# Patient Record
Sex: Female | Born: 1970 | Race: White | Hispanic: No | Marital: Married | Smoking: Never smoker
Health system: Southern US, Community
[De-identification: ages and names within clinical notes are randomized; demographics above are authoritative.]

## PROBLEM LIST (undated history)

## (undated) DIAGNOSIS — D649 Anemia, unspecified: Secondary | ICD-10-CM

## (undated) HISTORY — PX: NASAL SINUS SURGERY: SHX719

## (undated) HISTORY — PX: EYE SURGERY: SHX253

## (undated) HISTORY — PX: TUBAL LIGATION: SHX77

## (undated) HISTORY — PX: HAND SURGERY: SHX662

---

## 1997-08-12 ENCOUNTER — Inpatient Hospital Stay (HOSPITAL_COMMUNITY): Admission: AD | Admit: 1997-08-12 | Discharge: 1997-08-12 | Payer: Self-pay | Admitting: Obstetrics and Gynecology

## 1999-07-28 ENCOUNTER — Other Ambulatory Visit: Admission: RE | Admit: 1999-07-28 | Discharge: 1999-07-28 | Payer: Self-pay | Admitting: Obstetrics and Gynecology

## 1999-10-24 ENCOUNTER — Encounter: Payer: Self-pay | Admitting: Family Medicine

## 1999-10-24 ENCOUNTER — Encounter: Admission: RE | Admit: 1999-10-24 | Discharge: 1999-10-24 | Payer: Self-pay | Admitting: Family Medicine

## 2000-11-26 ENCOUNTER — Other Ambulatory Visit: Admission: RE | Admit: 2000-11-26 | Discharge: 2000-11-26 | Payer: Self-pay | Admitting: Obstetrics and Gynecology

## 2001-05-30 ENCOUNTER — Encounter: Admission: RE | Admit: 2001-05-30 | Discharge: 2001-05-30 | Payer: Self-pay | Admitting: Orthopedic Surgery

## 2001-05-30 ENCOUNTER — Encounter: Payer: Self-pay | Admitting: Orthopedic Surgery

## 2002-10-24 ENCOUNTER — Emergency Department (HOSPITAL_COMMUNITY): Admission: EM | Admit: 2002-10-24 | Discharge: 2002-10-24 | Payer: Self-pay | Admitting: Emergency Medicine

## 2003-01-07 ENCOUNTER — Other Ambulatory Visit: Admission: RE | Admit: 2003-01-07 | Discharge: 2003-01-07 | Payer: Self-pay | Admitting: Obstetrics and Gynecology

## 2003-09-02 ENCOUNTER — Emergency Department (HOSPITAL_COMMUNITY): Admission: EM | Admit: 2003-09-02 | Discharge: 2003-09-03 | Payer: Self-pay | Admitting: Emergency Medicine

## 2004-04-05 ENCOUNTER — Other Ambulatory Visit: Admission: RE | Admit: 2004-04-05 | Discharge: 2004-04-05 | Payer: Self-pay | Admitting: Obstetrics and Gynecology

## 2004-04-14 ENCOUNTER — Encounter: Admission: RE | Admit: 2004-04-14 | Discharge: 2004-04-14 | Payer: Self-pay | Admitting: Obstetrics and Gynecology

## 2011-03-29 ENCOUNTER — Inpatient Hospital Stay (HOSPITAL_COMMUNITY)
Admission: AD | Admit: 2011-03-29 | Discharge: 2011-03-29 | Disposition: A | Discharge status: Home / Self Care | Payer: BC Managed Care – PPO | Source: Ambulatory Visit | Attending: Obstetrics and Gynecology | Admitting: Obstetrics and Gynecology

## 2011-03-29 ENCOUNTER — Other Ambulatory Visit: Payer: Self-pay | Admitting: Obstetrics and Gynecology

## 2011-03-29 ENCOUNTER — Encounter (HOSPITAL_COMMUNITY): Payer: Self-pay

## 2011-03-29 DIAGNOSIS — O00109 Unspecified tubal pregnancy without intrauterine pregnancy: Secondary | ICD-10-CM | POA: Insufficient documentation

## 2011-03-29 HISTORY — DX: Anemia, unspecified: D64.9

## 2011-03-29 LAB — CBC
HCT: 38.6 % (ref 36.0–46.0)
Hemoglobin: 12.7 g/dL (ref 12.0–15.0)
MCH: 28.9 pg (ref 26.0–34.0)
MCHC: 32.9 g/dL (ref 30.0–36.0)
MCV: 87.9 fL (ref 78.0–100.0)
Platelets: 434 10*3/uL — ABNORMAL HIGH (ref 150–400)
RBC: 4.39 MIL/uL (ref 3.87–5.11)
RDW: 13 % (ref 11.5–15.5)
WBC: 9 10*3/uL (ref 4.0–10.5)

## 2011-03-29 LAB — DIFFERENTIAL
Basophils Absolute: 0 10*3/uL (ref 0.0–0.1)
Basophils Relative: 0 % (ref 0–1)
Eosinophils Absolute: 0.1 10*3/uL (ref 0.0–0.7)
Eosinophils Relative: 1 % (ref 0–5)
Lymphocytes Relative: 18 % (ref 12–46)
Lymphs Abs: 1.6 10*3/uL (ref 0.7–4.0)
Monocytes Absolute: 0.7 10*3/uL (ref 0.1–1.0)
Monocytes Relative: 8 % (ref 3–12)
Neutro Abs: 6.5 10*3/uL (ref 1.7–7.7)
Neutrophils Relative %: 73 % (ref 43–77)

## 2011-03-29 LAB — CREATININE, SERUM
Creatinine, Ser: 0.66 mg/dL (ref 0.50–1.10)
GFR calc Af Amer: >90 mL/min (ref >90)
GFR calc non Af Amer: >90 mL/min (ref >90)

## 2011-03-29 LAB — ABO/RH: ABO/RH(D): A NEG

## 2011-03-29 LAB — HCG, QUANTITATIVE, PREGNANCY: hCG, Beta Chain, Quant, S: 40 m[IU]/mL — ABNORMAL HIGH (ref <5)

## 2011-03-29 LAB — BUN: BUN: 7 mg/dL (ref 6–23)

## 2011-03-29 LAB — TYPE AND SCREEN
ABO/RH(D): A NEG
Antibody Screen: NEGATIVE

## 2011-03-29 LAB — AST: AST: 26 U/L (ref 0–37)

## 2011-03-29 MED ORDER — METHOTREXATE INJECTION FOR WOMEN'S HOSPITAL
50.0000 mg/m2 | Freq: Once | INTRAMUSCULAR | Status: AC
  Administered 2011-03-29: 85 mg via INTRAMUSCULAR
  Filled 2011-03-29: qty 1.7

## 2011-03-29 MED ORDER — RHO D IMMUNE GLOBULIN 1500 UNIT/2ML IJ SOLN
300.0000 ug | Freq: Once | INTRAMUSCULAR | Status: AC
  Administered 2011-03-29: 300 ug via INTRAMUSCULAR
  Filled 2011-03-29: qty 2

## 2011-03-29 MED ORDER — METHOTREXATE INJECTION FOR WOMEN'S HOSPITAL
50.0000 mg/m2 | Freq: Once | INTRAMUSCULAR | Status: DC
  Filled 2011-03-29: qty 1.5

## 2011-03-29 NOTE — ED Notes (Signed)
No adverse effect from methotrexate or rhophylac. Pt voices understanding to return Sunday for repeat bhcg. Comfort care provided, pt to review at home and will call with any questions or concerns.

## 2011-03-29 NOTE — ED Provider Notes (Signed)
Andrea Johns is a 41 y.o. female @ [redacted]w[redacted]d gestation who presents to MAU for MTX for ectopic pregnancy. She was evaluated in the office and orders for labs were entered prior to patient arrival. She is alert and oriented and in no acute distress. Medical screening exam complete and patient is stable to proceed with plan of care.    [place], [place] 03/29/11 1416

## 2011-03-29 NOTE — Progress Notes (Signed)
Pt states sent from MD office for MTX workup/inj. U/S confirmed left ectopic. Pt reports mild lower abd pain 3/10. Intermittent back pain. Reports heavy bleeding yesterday, lessened today.

## 2011-03-30 LAB — RH IG WORKUP (INCLUDES ABO/RH): ABO/RH(D): A NEG

## 2011-04-01 ENCOUNTER — Inpatient Hospital Stay (HOSPITAL_COMMUNITY)
Admission: AD | Admit: 2011-04-01 | Discharge: 2011-04-01 | Disposition: A | Discharge status: Home / Self Care | Payer: BC Managed Care – PPO | Source: Ambulatory Visit | Attending: Obstetrics and Gynecology | Admitting: Obstetrics and Gynecology

## 2011-04-01 DIAGNOSIS — O00109 Unspecified tubal pregnancy without intrauterine pregnancy: Secondary | ICD-10-CM | POA: Insufficient documentation

## 2011-04-01 DIAGNOSIS — O009 Unspecified ectopic pregnancy without intrauterine pregnancy: Secondary | ICD-10-CM

## 2011-04-01 NOTE — ED Notes (Signed)
M.Williams,CNM  Discussed  Results and POC with pt to come back for day 7 BHCG. Pt verbalized understanding.

## 2011-04-01 NOTE — ED Provider Notes (Signed)
History     Chief Complaint  Patient presents with  . Ectopic Pregnancy   HPI This is a 41 y.o. female at [redacted]w[redacted]d with a known ectopic pregnancy who presents for followup HCG following Methotrexate.  She denies pain and has had some light bleeding.   OB History    Grav Para Term Preterm Abortions TAB SAB Ect Mult Living   4 3 3  1   1  3       Past Medical History  Diagnosis Date  . Anemia     Past Surgical History  Procedure Date  . Hand surgery   . Eye surgery   . Tubal ligation     and reversal    Family History  Problem Relation Age of Onset  . Anesthesia problems Neg Hx     History  Substance Use Topics  . Smoking status: Never Smoker   . Smokeless tobacco: Never Used  . Alcohol Use: 0.6 oz/week    1 Glasses of wine per week    Allergies:  Allergies  Allergen Reactions  . Ciprofloxacin Nausea And Vomiting  . Codeine Nausea And Vomiting    Prescriptions prior to admission  Medication Sig Dispense Refill  . acetaminophen (TYLENOL) 500 MG tablet Take 500 mg by mouth every 6 (six) hours as needed. For pain and cramping      . Prenatal Vit-Fe Fumarate-FA (PRENATAL MULTIVITAMIN) TABS Take 1 tablet by mouth daily.        ROS As above  Physical Exam   Blood pressure 122/72, pulse 66, temperature 98.5 F (36.9 C), temperature source Oral, resp. rate 18, height 5\' 1"  (1.549 m), weight 140 lb 6.4 oz (63.685 kg), last menstrual period 02/11/2011, unknown if currently breastfeeding.  Physical Exam Deferred Results for orders placed during the hospital encounter of 04/01/11 (from the past 24 hour(s))  HCG, QUANTITATIVE, PREGNANCY     Status: Abnormal   Collection Time   04/01/11 11:18 AM      Component Value Range   hCG, Beta Chain, Quant, S 10 (*) <5 (mIU/mL)   Last Quant was 40  MAU Course  Procedures  Assessment and Plan  A:  Ectopic pregnancy s/p Methotrexate      Appropriately decreasing Quant HCGs P:  Will proceed with Day 7 HCG      Ectopic  precautions  Andrea Johns 04/01/2011, 12:16 PM

## 2011-04-01 NOTE — Progress Notes (Signed)
Day 4 MTX follow up. Pt reports still having vaginal bleeding with occational clots reports mild cramping.

## 2011-04-04 ENCOUNTER — Inpatient Hospital Stay (HOSPITAL_COMMUNITY)
Admission: AD | Admit: 2011-04-04 | Discharge: 2011-04-04 | Disposition: A | Discharge status: Home / Self Care | Payer: BC Managed Care – PPO | Source: Ambulatory Visit | Attending: Obstetrics and Gynecology | Admitting: Obstetrics and Gynecology

## 2011-04-04 DIAGNOSIS — O009 Unspecified ectopic pregnancy without intrauterine pregnancy: Secondary | ICD-10-CM

## 2011-04-04 DIAGNOSIS — O00109 Unspecified tubal pregnancy without intrauterine pregnancy: Secondary | ICD-10-CM | POA: Insufficient documentation

## 2011-04-04 NOTE — Progress Notes (Signed)
Patient to MAU for repeat BHCG. Denies any pain or bleeding today.

## 2011-04-04 NOTE — ED Provider Notes (Signed)
History   Pt presents today on day 7 s/p methotrexate for a left ectopic preg. She states she is doing well and is having no problems. She now only has minimal pain and virtually no vag bleeding. She has no new complaints at this time.  Chief Complaint  Patient presents with  . Follow-up   HPI  OB History    Grav Para Term Preterm Abortions TAB SAB Ect Mult Living   4 3 3  1   1  3       Past Medical History  Diagnosis Date  . Anemia     Past Surgical History  Procedure Date  . Hand surgery   . Eye surgery   . Tubal ligation     and reversal    Family History  Problem Relation Age of Onset  . Anesthesia problems Neg Hx     History  Substance Use Topics  . Smoking status: Never Smoker   . Smokeless tobacco: Never Used  . Alcohol Use: 0.6 oz/week    1 Glasses of wine per week    Allergies:  Allergies  Allergen Reactions  . Ciprofloxacin Nausea And Vomiting  . Codeine Nausea And Vomiting    Prescriptions prior to admission  Medication Sig Dispense Refill  . acetaminophen (TYLENOL) 500 MG tablet Take 500 mg by mouth every 6 (six) hours as needed. For pain and cramping      . Prenatal Vit-Fe Fumarate-FA (PRENATAL MULTIVITAMIN) TABS Take 1 tablet by mouth daily.        Review of Systems  Constitutional: Negative for fever and chills.  Eyes: Negative for blurred vision and double vision.  Cardiovascular: Negative for chest pain.  Gastrointestinal: Negative for nausea, vomiting, abdominal pain, diarrhea and constipation.  Genitourinary: Negative for dysuria, urgency and frequency.  Neurological: Negative for dizziness and headaches.  Psychiatric/Behavioral: Negative for depression and suicidal ideas.   Physical Exam   Blood pressure 136/74, pulse 91, temperature 98.5 F (36.9 C), temperature source Oral, resp. rate 16, last menstrual period 02/11/2011, SpO2 99.00%, unknown if currently breastfeeding.  Physical Exam  Nursing note and vitals  reviewed. Constitutional: She is oriented to person, place, and time. She appears well-developed and well-nourished. No distress.  HENT:  Head: Normocephalic and atraumatic.  Eyes: EOM are normal. Pupils are equal, round, and reactive to light.  GI: Soft. She exhibits no distension and no mass. There is no tenderness. There is no rebound and no guarding.  Neurological: She is alert and oriented to person, place, and time.  Skin: Skin is warm and dry. She is not diaphoretic.  Psychiatric: She has a normal mood and affect. Her behavior is normal. Judgment and thought content normal.    MAU Course  Procedures  Results for orders placed during the hospital encounter of 04/04/11 (from the past 24 hour(s))  HCG, QUANTITATIVE, PREGNANCY     Status: Normal   Collection Time   04/04/11  9:58 AM      Component Value Range   hCG, Beta Chain, Quant, S 3  <5 (mIU/mL)   Discussed pt with Dr. Ambrose Mantle.  Assessment and Plan  Ectopic preg: pt doing well. She will f/u in Dr. Ebony Hail office. Discussed diet, activity, risks, and precautions.  Clinton Gallant. Rice III, DrHSc, MPAS, PA-C  04/04/2011, 9:45 AM   Henrietta Hoover, PA 04/04/11 1042

## 2012-02-21 ENCOUNTER — Other Ambulatory Visit (HOSPITAL_COMMUNITY): Payer: Self-pay | Admitting: Obstetrics and Gynecology

## 2012-02-21 DIAGNOSIS — O3680X0 Pregnancy with inconclusive fetal viability, not applicable or unspecified: Secondary | ICD-10-CM

## 2012-02-22 ENCOUNTER — Inpatient Hospital Stay (HOSPITAL_COMMUNITY)
Admission: AD | Admit: 2012-02-22 | Discharge: 2012-02-22 | Disposition: A | Discharge status: Home / Self Care | Payer: BC Managed Care – PPO | Source: Ambulatory Visit | Attending: Obstetrics and Gynecology | Admitting: Obstetrics and Gynecology

## 2012-02-22 ENCOUNTER — Encounter (HOSPITAL_COMMUNITY): Payer: Self-pay | Admitting: *Deleted

## 2012-02-22 DIAGNOSIS — O009 Unspecified ectopic pregnancy without intrauterine pregnancy: Secondary | ICD-10-CM

## 2012-02-22 DIAGNOSIS — O00109 Unspecified tubal pregnancy without intrauterine pregnancy: Secondary | ICD-10-CM | POA: Insufficient documentation

## 2012-02-22 LAB — CBC WITH DIFFERENTIAL/PLATELET
Basophils Absolute: 0 10*3/uL (ref 0.0–0.1)
Basophils Relative: 0 % (ref 0–1)
MCHC: 33.4 g/dL (ref 30.0–36.0)
Neutro Abs: 8.4 10*3/uL — ABNORMAL HIGH (ref 1.7–7.7)
Neutrophils Relative %: 75 % (ref 43–77)
Platelets: 467 10*3/uL — ABNORMAL HIGH (ref 150–400)
RDW: 13.1 % (ref 11.5–15.5)

## 2012-02-22 LAB — CREATININE, SERUM
GFR calc Af Amer: >90 mL/min (ref >90)
GFR calc non Af Amer: >90 mL/min (ref >90)

## 2012-02-22 LAB — HCG, QUANTITATIVE, PREGNANCY: hCG, Beta Chain, Quant, S: 330 m[IU]/mL — ABNORMAL HIGH (ref <5)

## 2012-02-22 LAB — BUN: BUN: 7 mg/dL (ref 6–23)

## 2012-02-22 LAB — AST: AST: 18 U/L (ref 0–37)

## 2012-02-22 MED ORDER — METHOTREXATE INJECTION FOR WOMEN'S HOSPITAL
50.0000 mg/m2 | Freq: Once | INTRAMUSCULAR | Status: AC
  Administered 2012-02-22: 85 mg via INTRAMUSCULAR
  Filled 2012-02-22: qty 1.7

## 2012-02-22 NOTE — MAU Provider Note (Signed)
History     CSN: [id number]  Arrival date and time: 02/22/12 1946   First Provider Initiated Contact with Patient 02/22/12 2004      Chief Complaint  Patient presents with  . Injections   HPI Andrea Johns is 41 y.o. [id number] Unknown weeks presenting with ? Ectopic pregnancy.  Dr Ellyn Hack called earlier to tell us she would be coming in for MTX.  Patient states she had doubling BHCGs until today when there was a very slight rise in the result in 48 hrs.  She states she feels good today but had lower right pelvic pain earlier this week.  She states she is not bleeding but having dark discharge like"old blood".      Past Medical History  Diagnosis Date  . Anemia     Past Surgical History  Procedure Date  . Hand surgery   . Eye surgery   . Tubal ligation     and reversal    Family History  Problem Relation Age of Onset  . Anesthesia problems Neg Hx     History  Substance Use Topics  . Smoking status: Never Smoker   . Smokeless tobacco: Never Used  . Alcohol Use: 0.6 oz/week    1 Glasses of wine per week    Allergies:  Allergies  Allergen Reactions  . Ciprofloxacin Nausea And Vomiting  . Codeine Nausea And Vomiting    Prescriptions prior to admission  Medication Sig Dispense Refill  . acetaminophen (TYLENOL) 500 MG tablet Take 500 mg by mouth every 6 (six) hours as needed. For pain and cramping      . Prenatal Vit-Fe Fumarate-FA (PRENATAL MULTIVITAMIN) TABS Take 1 tablet by mouth daily.        Review of Systems  Constitutional: Negative.   Gastrointestinal: Negative for abdominal pain.  Genitourinary:       + for scant dark discharge   Physical Exam   Blood pressure 134/82, pulse 97, temperature 98 F (36.7 C), resp. rate 20, last menstrual period 01/15/2012.  Physical Exam  Constitutional: She is oriented to person, place, and time. She appears well-developed and well-nourished. No distress.  Cardiovascular: Normal rate.   Respiratory: Effort  normal.  Genitourinary:       Not indicated  Neurological: She is alert and oriented to person, place, and time.  Psychiatric: She has a normal mood and affect. Her behavior is normal.   Results for orders placed during the hospital encounter of 02/22/12 (from the past 24 hour(s))  CBC WITH DIFFERENTIAL     Status: Abnormal   Collection Time   02/22/12  8:13 PM      Component Value Range   WBC 11.1 (*) 4.0 - 10.5 K/uL   RBC 4.39  3.87 - 5.11 MIL/uL   Hemoglobin 12.8  12.0 - 15.0 g/dL   HCT 14.7  82.9 - 56.2 %   MCV 87.2  78.0 - 100.0 fL   MCH 29.2  26.0 - 34.0 pg   MCHC 33.4  30.0 - 36.0 g/dL   RDW 13.0  86.5 - 78.4 %   Platelets 467 (*) 150 - 400 K/uL   Neutrophils Relative 75  43 - 77 %   Neutro Abs 8.4 (*) 1.7 - 7.7 K/uL   Lymphocytes Relative 17  12 - 46 %   Lymphs Abs 1.9  0.7 - 4.0 K/uL   Monocytes Relative 8  3 - 12 %   Monocytes Absolute 0.9  0.1 - 1.0 K/uL  Eosinophils Relative 0  0 - 5 %   Eosinophils Absolute 0.0  0.0 - 0.7 K/uL   Basophils Relative 0  0 - 1 %   Basophils Absolute 0.0  0.0 - 0.1 K/uL   MAU Course  Procedures  MDM Discussed at length the reason patient was here.  Gave her time to ask questions.  She states that with previous pregnancies she felt pregnant and that she knew things were good but this week, she doesn't feel pregnancy.  She states she wants to proceed with MTX. Reported to Dr. Ellyn Hack that in patient's record from 1/13--her blood type is A Neg.  Order given to delay Rhophylac until bleeding, per Dr. Ellyn Hack.  20:36 Care turned over to H. Mathews Robinsons, CNM.  She will discharge the patient. Assessment and Plan  A:  Non Reassuring BHCGs       Ectopic pregnancy  P: Methotrexate given.     Return on Monday, Dec 23 for repeat BHCG    Ectopic precautions given  [name],EVE M 02/22/2012, 8:14 PM

## 2012-02-22 NOTE — MAU Note (Signed)
Pt here for MTX. States Dr Ellyn Hack did u/s today and has "junky R ovary". Possibly ruptured cyst on R but BHCG not rising appropriately. Had similar situation in January and received MTX then for ?ectopic vs SAB.

## 2012-02-22 NOTE — Progress Notes (Signed)
Written and verbal d/c instructions given and understanding voiced. Have discussed ectopic precautions and pt has handout and reviewed.

## 2012-02-25 ENCOUNTER — Inpatient Hospital Stay (HOSPITAL_COMMUNITY)
Admission: AD | Admit: 2012-02-25 | Discharge: 2012-02-25 | Disposition: A | Discharge status: Home / Self Care | Payer: BC Managed Care – PPO | Source: Ambulatory Visit | Attending: Obstetrics and Gynecology | Admitting: Obstetrics and Gynecology

## 2012-02-25 DIAGNOSIS — O00109 Unspecified tubal pregnancy without intrauterine pregnancy: Secondary | ICD-10-CM

## 2012-02-25 DIAGNOSIS — Z298 Encounter for other specified prophylactic measures: Secondary | ICD-10-CM | POA: Insufficient documentation

## 2012-02-25 DIAGNOSIS — Z2989 Encounter for other specified prophylactic measures: Secondary | ICD-10-CM | POA: Insufficient documentation

## 2012-02-25 DIAGNOSIS — O009 Unspecified ectopic pregnancy without intrauterine pregnancy: Secondary | ICD-10-CM

## 2012-02-25 MED ORDER — RHO D IMMUNE GLOBULIN 1500 UNIT/2ML IJ SOLN
300.0000 ug | INTRAMUSCULAR | Status: AC
  Administered 2012-02-25: 300 ug via INTRAMUSCULAR
  Filled 2012-02-25: qty 2

## 2012-02-25 NOTE — MAU Note (Signed)
Had had some brownish spotting, became red, though is a light flow last night.  Feels so much gas on stomach.

## 2012-02-25 NOTE — MAU Note (Signed)
Pt here for day 4 hcg post MTX. Started bleeding yesterday. Asking about Rhophyllac/ paged  Dr Senaida Ores, regarding order .

## 2012-02-25 NOTE — MAU Provider Note (Signed)
  History     CSN: [id number]  Arrival date and time: 02/25/12 0941   None     No chief complaint on file.  HPI  Pt is here for Day 4 HCG after MTX and Rhophylac.  Pt is without complaint, no spotting, bleeding or cramping.  Past Medical History  Diagnosis Date  . Anemia     Past Surgical History  Procedure Date  . Hand surgery   . Eye surgery   . Tubal ligation     and reversal    Family History  Problem Relation Age of Onset  . Anesthesia problems Neg Hx     History  Substance Use Topics  . Smoking status: Never Smoker   . Smokeless tobacco: Never Used  . Alcohol Use: 0.6 oz/week    1 Glasses of wine per week    Allergies:  Allergies  Allergen Reactions  . Ciprofloxacin Nausea And Vomiting  . Codeine Nausea And Vomiting    Prescriptions prior to admission  Medication Sig Dispense Refill  . acetaminophen (TYLENOL) 500 MG tablet Take 500 mg by mouth every 6 (six) hours as needed. For pain and cramping      . Prenatal Vit-Fe Fumarate-FA (PRENATAL MULTIVITAMIN) TABS Take 1 tablet by mouth daily.        ROS Physical Exam   Last menstrual period 01/15/2012.  Physical Exam  Vitals reviewed. Constitutional: She is oriented to person, place, and time. She appears well-developed and well-nourished.  HENT:  Head: Normocephalic.  Eyes: Pupils are equal, round, and reactive to light.  Neck: Normal range of motion. Neck supple.  Cardiovascular: Normal rate.   Respiratory: Effort normal.  Musculoskeletal: Normal range of motion.  Neurological: She is alert and oriented to person, place, and time.  Skin: Skin is warm and dry.  Psychiatric: She has a normal mood and affect.    MAU Course  Procedures Results for orders placed during the hospital encounter of 02/25/12 (from the past 24 hour(s))  HCG, QUANTITATIVE, PREGNANCY     Status: Abnormal   Collection Time   02/25/12 10:04 AM      Component Value Range   hCG, Beta Chain, Quant, S 57 (*) <5 mIU/mL   RH IG WORKUP (INCLUDES ABO/RH)     Status: Normal (Preliminary result)   Collection Time   02/25/12 10:18 AM      Component Value Range   Gestational Age(Wks) 5.6     ABO/RH(D) A NEG     Antibody Screen NEG     Unit Number [id number]     Blood Component Type RHIG     Unit division 00     Status of Unit ISSUED     Transfusion Status OK TO TRANSFUSE      Assessment and Plan  Day 4 s/p MTX F/u Day 7 for repeat HCG Sooner if increase in pain or bleeding  Danylle Ouk 02/25/2012, 1:17 PM

## 2012-02-26 LAB — RH IG WORKUP (INCLUDES ABO/RH)
ABO/RH(D): A NEG
Unit division: 0

## 2012-02-28 ENCOUNTER — Ambulatory Visit (HOSPITAL_COMMUNITY): Admission: RE | Admit: 2012-02-28 | Payer: BC Managed Care – PPO | Source: Ambulatory Visit

## 2012-02-28 ENCOUNTER — Other Ambulatory Visit (HOSPITAL_COMMUNITY): Payer: Self-pay | Admitting: Obstetrics and Gynecology

## 2012-02-28 ENCOUNTER — Inpatient Hospital Stay (HOSPITAL_COMMUNITY)
Admission: AD | Admit: 2012-02-28 | Discharge: 2012-02-28 | Disposition: A | Discharge status: Home / Self Care | Payer: BC Managed Care – PPO | Source: Ambulatory Visit | Attending: Obstetrics and Gynecology | Admitting: Obstetrics and Gynecology

## 2012-02-28 DIAGNOSIS — O009 Unspecified ectopic pregnancy without intrauterine pregnancy: Secondary | ICD-10-CM

## 2012-02-28 DIAGNOSIS — O00109 Unspecified tubal pregnancy without intrauterine pregnancy: Secondary | ICD-10-CM

## 2012-02-28 DIAGNOSIS — O3680X0 Pregnancy with inconclusive fetal viability, not applicable or unspecified: Secondary | ICD-10-CM

## 2012-02-28 LAB — HCG, QUANTITATIVE, PREGNANCY: hCG, Beta Chain, Quant, S: 40 m[IU]/mL — ABNORMAL HIGH (ref <5)

## 2012-02-28 NOTE — MAU Note (Signed)
Patient to MAU for day 7 BHCG s/p MTX. Patient states she does have a little right lower abdominal pressure only with a full bladder. No other pain. Has red bleeding on and off.

## 2012-02-28 NOTE — MAU Provider Note (Signed)
  History     CSN: [id number]  Arrival date and time: 02/28/12 1156   None     Chief Complaint  Patient presents with  . Follow-up   HPI Andrea Johns is here for FU BHCG, day 7 after MTX injection. She denies any pain, and is having some very light bleeding.   Past Medical History  Diagnosis Date  . Anemia     Past Surgical History  Procedure Date  . Hand surgery   . Eye surgery   . Tubal ligation     and reversal    Family History  Problem Relation Age of Onset  . Anesthesia problems Neg Hx     History  Substance Use Topics  . Smoking status: Never Smoker   . Smokeless tobacco: Never Used  . Alcohol Use: 0.6 oz/week    1 Glasses of wine per week    Allergies:  Allergies  Allergen Reactions  . Ciprofloxacin Nausea And Vomiting  . Codeine Nausea And Vomiting    Prescriptions prior to admission  Medication Sig Dispense Refill  . acetaminophen (TYLENOL) 500 MG tablet Take 500 mg by mouth every 6 (six) hours as needed. For pain and cramping        Review of Systems  Gastrointestinal: Negative for nausea, vomiting and abdominal pain.  Neurological: Negative for dizziness.   Physical Exam   Blood pressure 144/72, pulse 70, temperature 98 F (36.7 C), temperature source Oral, resp. rate 16, last menstrual period 01/15/2012, SpO2 100.00%.  Physical Exam  Nursing note and vitals reviewed. Constitutional: She is oriented to person, place, and time. She appears well-developed and well-nourished. No distress.  Cardiovascular: Normal rate.   Respiratory: Effort normal.  Neurological: She is alert and oriented to person, place, and time.  Skin: Skin is warm and dry.  Psychiatric: She has a normal mood and affect.    MAU Course  Procedures  Results for orders placed during the hospital encounter of 02/28/12 (from the past 24 hour(s))  HCG, QUANTITATIVE, PREGNANCY     Status: Abnormal   Collection Time   02/28/12 12:15 PM      Component Value Range    hCG, Beta Chain, Quant, S 40 (*) <5 mIU/mL    Assessment and Plan  Ectopic pregnancy s/p MTX day 7 Bhcg is decreasing Danger signs reviewed FU in 1 week for repeat BHCG.  Pt is leaving for [place]. Understands that she needs to head to ER there with any new onset abdominal pain or increased vaginal bleeding.  Tawnya Crook 02/28/2012, 12:59 PM

## 2012-03-06 ENCOUNTER — Inpatient Hospital Stay (HOSPITAL_COMMUNITY)
Admission: AD | Admit: 2012-03-06 | Discharge: 2012-03-06 | Disposition: A | Discharge status: Home / Self Care | Payer: BC Managed Care – PPO | Source: Ambulatory Visit | Attending: Obstetrics and Gynecology | Admitting: Obstetrics and Gynecology

## 2012-03-06 ENCOUNTER — Ambulatory Visit (HOSPITAL_COMMUNITY): Payer: BC Managed Care – PPO

## 2012-03-06 DIAGNOSIS — O00109 Unspecified tubal pregnancy without intrauterine pregnancy: Secondary | ICD-10-CM | POA: Insufficient documentation

## 2012-03-06 DIAGNOSIS — O009 Unspecified ectopic pregnancy without intrauterine pregnancy: Secondary | ICD-10-CM

## 2012-03-06 LAB — HCG, QUANTITATIVE, PREGNANCY: hCG, Beta Chain, Quant, S: 21 m[IU]/mL — ABNORMAL HIGH (ref <5)

## 2012-03-06 NOTE — ED Notes (Signed)
Discussed results and plan to have pt f/u in one week for repeat BHCG. Pt agreeable to plan.

## 2012-03-06 NOTE — MAU Note (Signed)
Patient to MAU for follow up BHCG s/p MTX. Patient states she still has a slight pain in the right side especially with a full bladder. Has bleeding off and on, less than a period.

## 2012-03-06 NOTE — MAU Provider Note (Signed)
Attestation of Attending Supervision of Advanced Practitioner (CNM/NP): Evaluation and management procedures were performed by the Advanced Practitioner under my supervision and collaboration.  I have reviewed the Advanced Practitioner's note and chart, and I agree with the management and plan.  HARRAWAY-SMITH, Joeanna Howdyshell 5:39 PM

## 2012-03-06 NOTE — MAU Provider Note (Signed)
  History     CSN: [id number]  Arrival date and time: 03/06/12 1522   None     Chief Complaint  Patient presents with  . Follow-up   HPI Ms. Becci Batty is a 42 y.o. G5 P3013 who presents to MAU today for follow-up quant bHCG. She is 14 days s/p MTX. She is not having any bleeding and mild RLQ pain on occasion.   OB History    Grav Para Term Preterm Abortions TAB SAB Ect Mult Living   5 3 3  1   1  3       Past Medical History  Diagnosis Date  . Anemia     Past Surgical History  Procedure Date  . Hand surgery   . Eye surgery   . Tubal ligation     and reversal    Family History  Problem Relation Age of Onset  . Anesthesia problems Neg Hx     History  Substance Use Topics  . Smoking status: Never Smoker   . Smokeless tobacco: Never Used  . Alcohol Use: 0.6 oz/week    1 Glasses of wine per week    Allergies:  Allergies  Allergen Reactions  . Ciprofloxacin Nausea And Vomiting  . Codeine Nausea And Vomiting    Prescriptions prior to admission  Medication Sig Dispense Refill  . acetaminophen (TYLENOL) 500 MG tablet Take 500 mg by mouth every 6 (six) hours as needed. For pain and cramping        ROS All negative unless otherwise noted in HPI Physical Exam   Blood pressure 131/73, pulse 81, temperature 98.1 F (36.7 C), temperature source Oral, resp. rate 16, last menstrual period 01/15/2012, SpO2 100.00%.  Physical Exam  MAU Course  Procedures  MDM Discussed with Dr. Ambrose Mantle. He would like the patient to follow-up in MAU in one week for repeat quantitative, hCG  Assessment and Plan  A: Ectopic pregnancy s/p MTX  P: Discharge home Patient will return to MAU in one week for follow-up quant hCG  Freddi Starr, PA-C 03/06/2012, 4:40 PM

## 2012-03-13 ENCOUNTER — Ambulatory Visit (HOSPITAL_COMMUNITY): Payer: BC Managed Care – PPO

## 2012-03-14 ENCOUNTER — Inpatient Hospital Stay (HOSPITAL_COMMUNITY)
Admission: AD | Admit: 2012-03-14 | Discharge: 2012-03-14 | Disposition: A | Discharge status: Home / Self Care | Payer: BC Managed Care – PPO | Source: Ambulatory Visit | Attending: Obstetrics and Gynecology | Admitting: Obstetrics and Gynecology

## 2012-03-14 DIAGNOSIS — Z09 Encounter for follow-up examination after completed treatment for conditions other than malignant neoplasm: Secondary | ICD-10-CM

## 2012-03-14 DIAGNOSIS — O009 Unspecified ectopic pregnancy without intrauterine pregnancy: Secondary | ICD-10-CM

## 2012-03-14 DIAGNOSIS — O00109 Unspecified tubal pregnancy without intrauterine pregnancy: Secondary | ICD-10-CM

## 2012-03-14 NOTE — MAU Provider Note (Signed)
  History     CSN: [id number]  Arrival date and time: 03/14/12 1022   None     No chief complaint on file.  HPI Andrea Johns is 42 y.o. [id number] presents for continued care for Ectopic Pregnancy treated with MTX.  Initially BHCGs had not doubled in the office.  Dr. Ellyn Hack ordered MTX which was given on 12/20.  On that date BHCG 330.  BHCGs have fallen to the last one on 03/06/12 of 21. Rhophlac was given on 02/25/12.   She is not bleeding or having pain today.  She wishes to go home and have [place] call her with results.  She is painfree and stable.  I will call both she and Dr. Ambrose Johns when results back.     Past Medical History  Diagnosis Date  . Anemia     Past Surgical History  Procedure Date  . Hand surgery   . Eye surgery   . Tubal ligation     and reversal    Family History  Problem Relation Age of Onset  . Anesthesia problems Neg Hx     History  Substance Use Topics  . Smoking status: Never Smoker   . Smokeless tobacco: Never Used  . Alcohol Use: 0.6 oz/week    1 Glasses of wine per week    Allergies:  Allergies  Allergen Reactions  . Ciprofloxacin Nausea And Vomiting  . Codeine Nausea And Vomiting    Prescriptions prior to admission  Medication Sig Dispense Refill  . acetaminophen (TYLENOL) 500 MG tablet Take 500 mg by mouth every 6 (six) hours as needed. For pain and cramping        Review of Systems  Gastrointestinal: Negative for abdominal pain.  Genitourinary:       Neg for bleeding   Physical Exam   Last menstrual period 01/15/2012.  Physical Exam  Constitutional: She is oriented to person, place, and time. She appears well-developed and well-nourished. No distress.  Genitourinary:       Not indicated  Neurological: She is alert and oriented to person, place, and time.  Psychiatric: She has a normal mood and affect. Her behavior is normal.   Results for orders placed during the hospital encounter of 03/14/12 (from the past 24 hour(s))    HCG, QUANTITATIVE, PREGNANCY     Status: Normal   Collection Time   03/14/12 10:40 AM      Component Value Range   hCG, Beta Chain, Quant, S 1  <5 mIU/mL   MAU Course  Procedures  MDM Reported result to Dr. Ambrose Johns at 11:58.    Assessment and Plan  A:  Ectopic pregnancy treated with Methotrexate      BHCG now below 1  P:  Make appointment with the office to discuss future pregnancies vs contraception.  Patient is not sure which at this time.  Andrea Johns,EVE M 03/14/2012, 10:35 AM

## 2012-03-14 NOTE — MAU Note (Signed)
Patient to MAU for follow BHCG s/p MTX. Patient denies any pain or bleeding.

## 2012-03-20 ENCOUNTER — Other Ambulatory Visit (HOSPITAL_COMMUNITY): Payer: Self-pay | Admitting: Obstetrics and Gynecology

## 2012-03-20 DIAGNOSIS — Z1231 Encounter for screening mammogram for malignant neoplasm of breast: Secondary | ICD-10-CM

## 2012-03-27 ENCOUNTER — Encounter (HOSPITAL_COMMUNITY): Payer: Self-pay

## 2012-03-27 ENCOUNTER — Ambulatory Visit (HOSPITAL_COMMUNITY)
Admission: RE | Admit: 2012-03-27 | Discharge: 2012-03-27 | Disposition: A | Discharge status: Home / Self Care | Payer: BC Managed Care – PPO | Source: Ambulatory Visit | Attending: Obstetrics and Gynecology | Admitting: Obstetrics and Gynecology

## 2012-03-27 DIAGNOSIS — Z1231 Encounter for screening mammogram for malignant neoplasm of breast: Secondary | ICD-10-CM | POA: Insufficient documentation

## 2012-03-31 ENCOUNTER — Other Ambulatory Visit: Payer: Self-pay | Admitting: Obstetrics and Gynecology

## 2012-03-31 DIAGNOSIS — R928 Other abnormal and inconclusive findings on diagnostic imaging of breast: Secondary | ICD-10-CM

## 2012-04-11 ENCOUNTER — Other Ambulatory Visit: Payer: Self-pay | Admitting: Obstetrics and Gynecology

## 2012-04-11 ENCOUNTER — Ambulatory Visit
Admission: RE | Admit: 2012-04-11 | Discharge: 2012-04-11 | Disposition: A | Discharge status: Home / Self Care | Payer: BC Managed Care – PPO | Source: Ambulatory Visit | Attending: Obstetrics and Gynecology | Admitting: Obstetrics and Gynecology

## 2012-04-11 DIAGNOSIS — R928 Other abnormal and inconclusive findings on diagnostic imaging of breast: Secondary | ICD-10-CM

## 2013-12-20 IMAGING — MG MM DIGITAL DIAGNOSTIC LIMITED*L*
3 series · 3 of 3 positions shown · non-contrast
Comparison: [DATE] [DATE], [DATE], [DATE] [DATE], [DATE]

CLINICAL DATA: Called back from screening mammogram for possible
asymmetry left breast.  The patient had a recent ectopic pregnancy
in February 2012.

DIGITAL DIAGNOSTIC LEFT MAMMOGRAM April 11, 2012 AND LEFT BREAST
ULTRASOUND:

[L CC (1 of 2)]
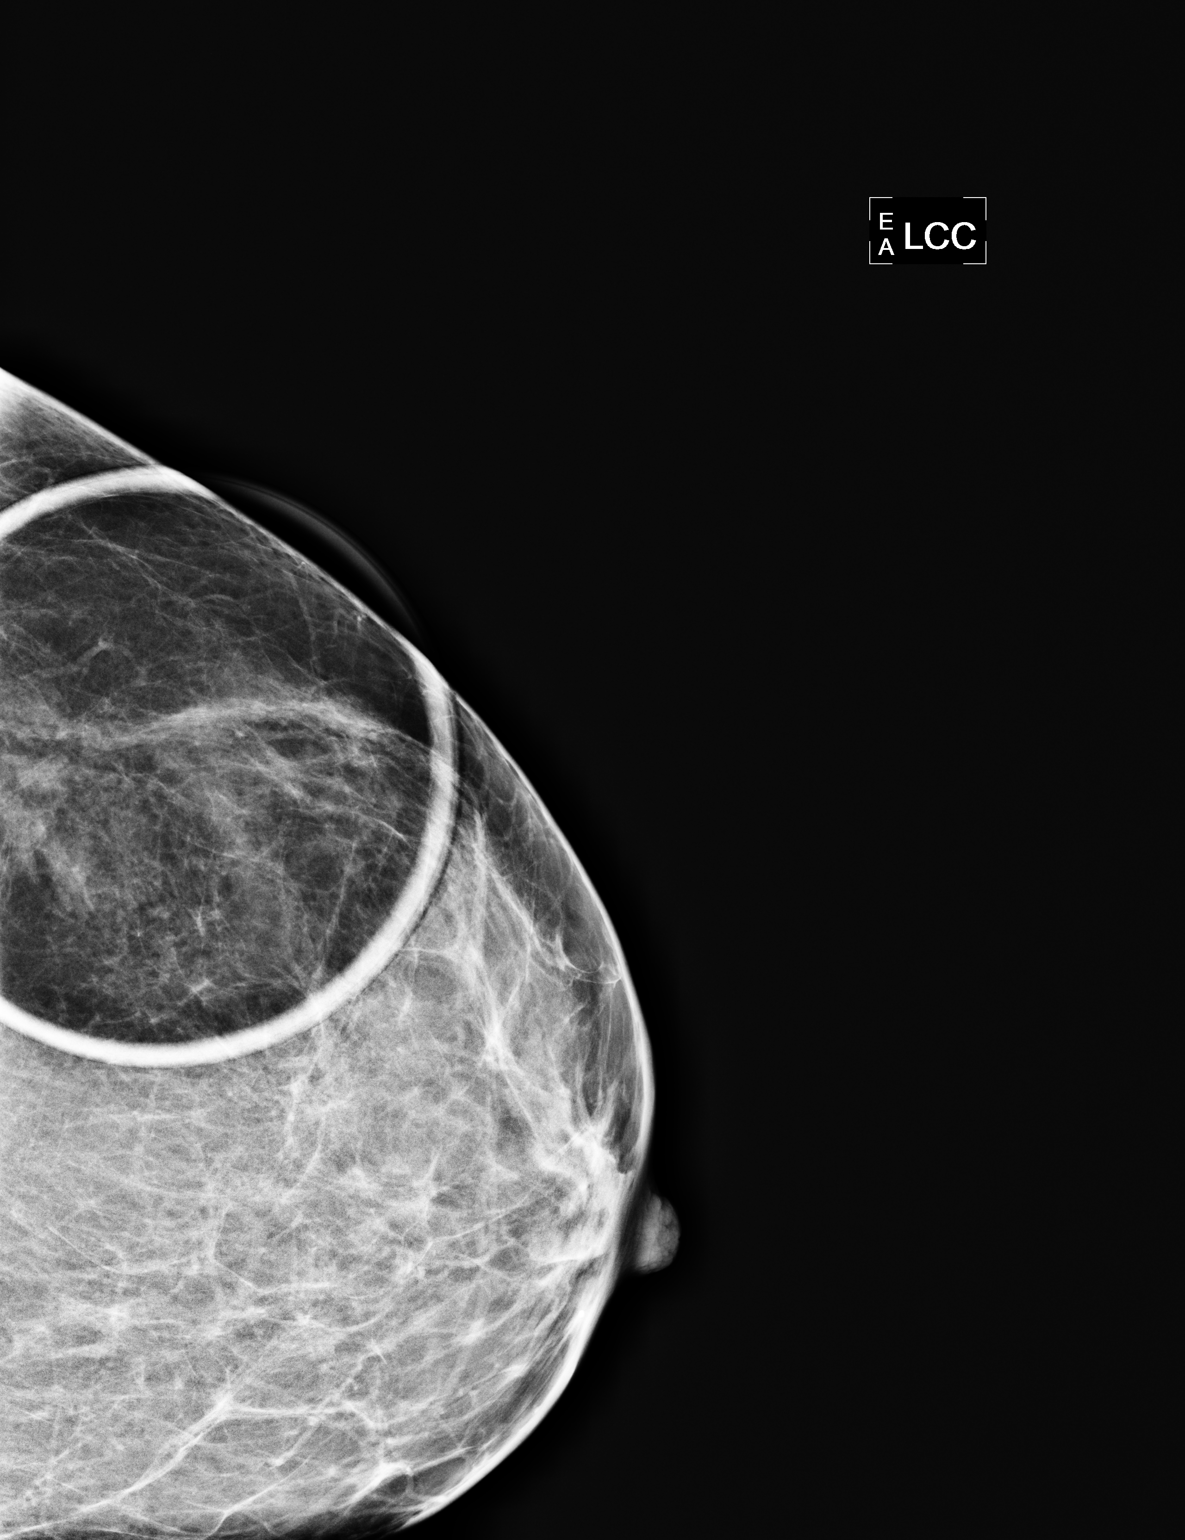

[L MLO]
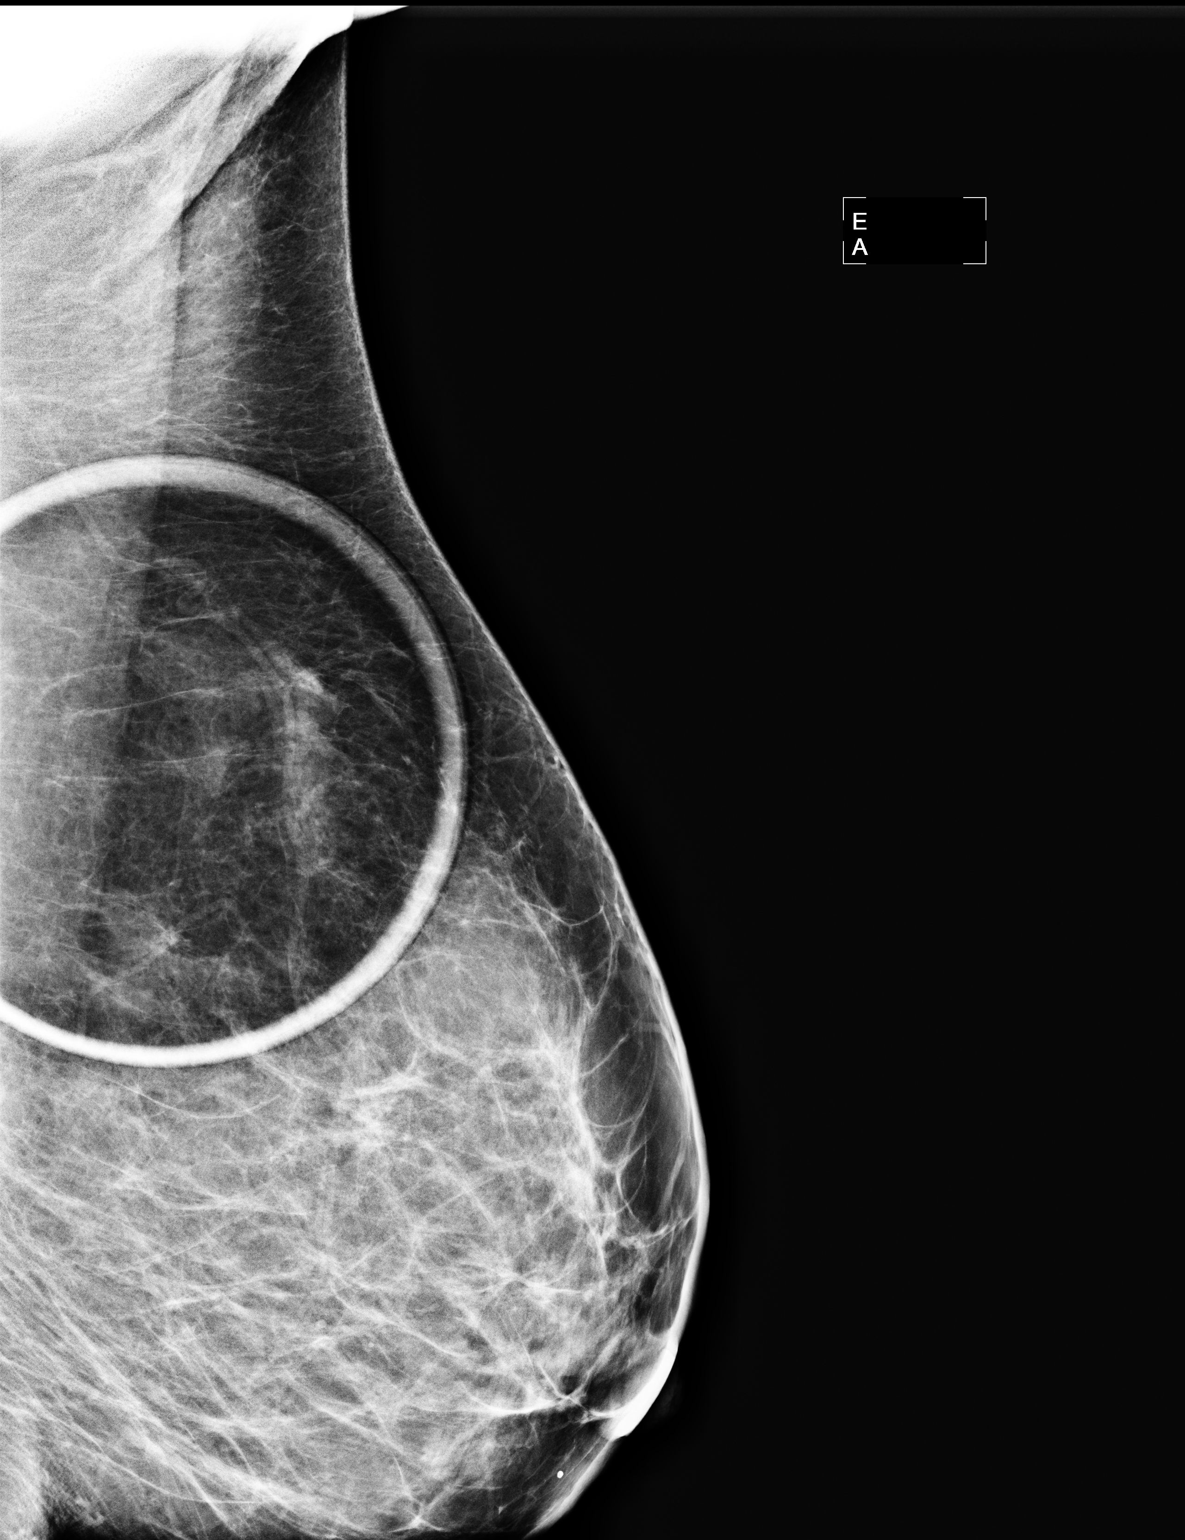

[L CC (2 of 2)]
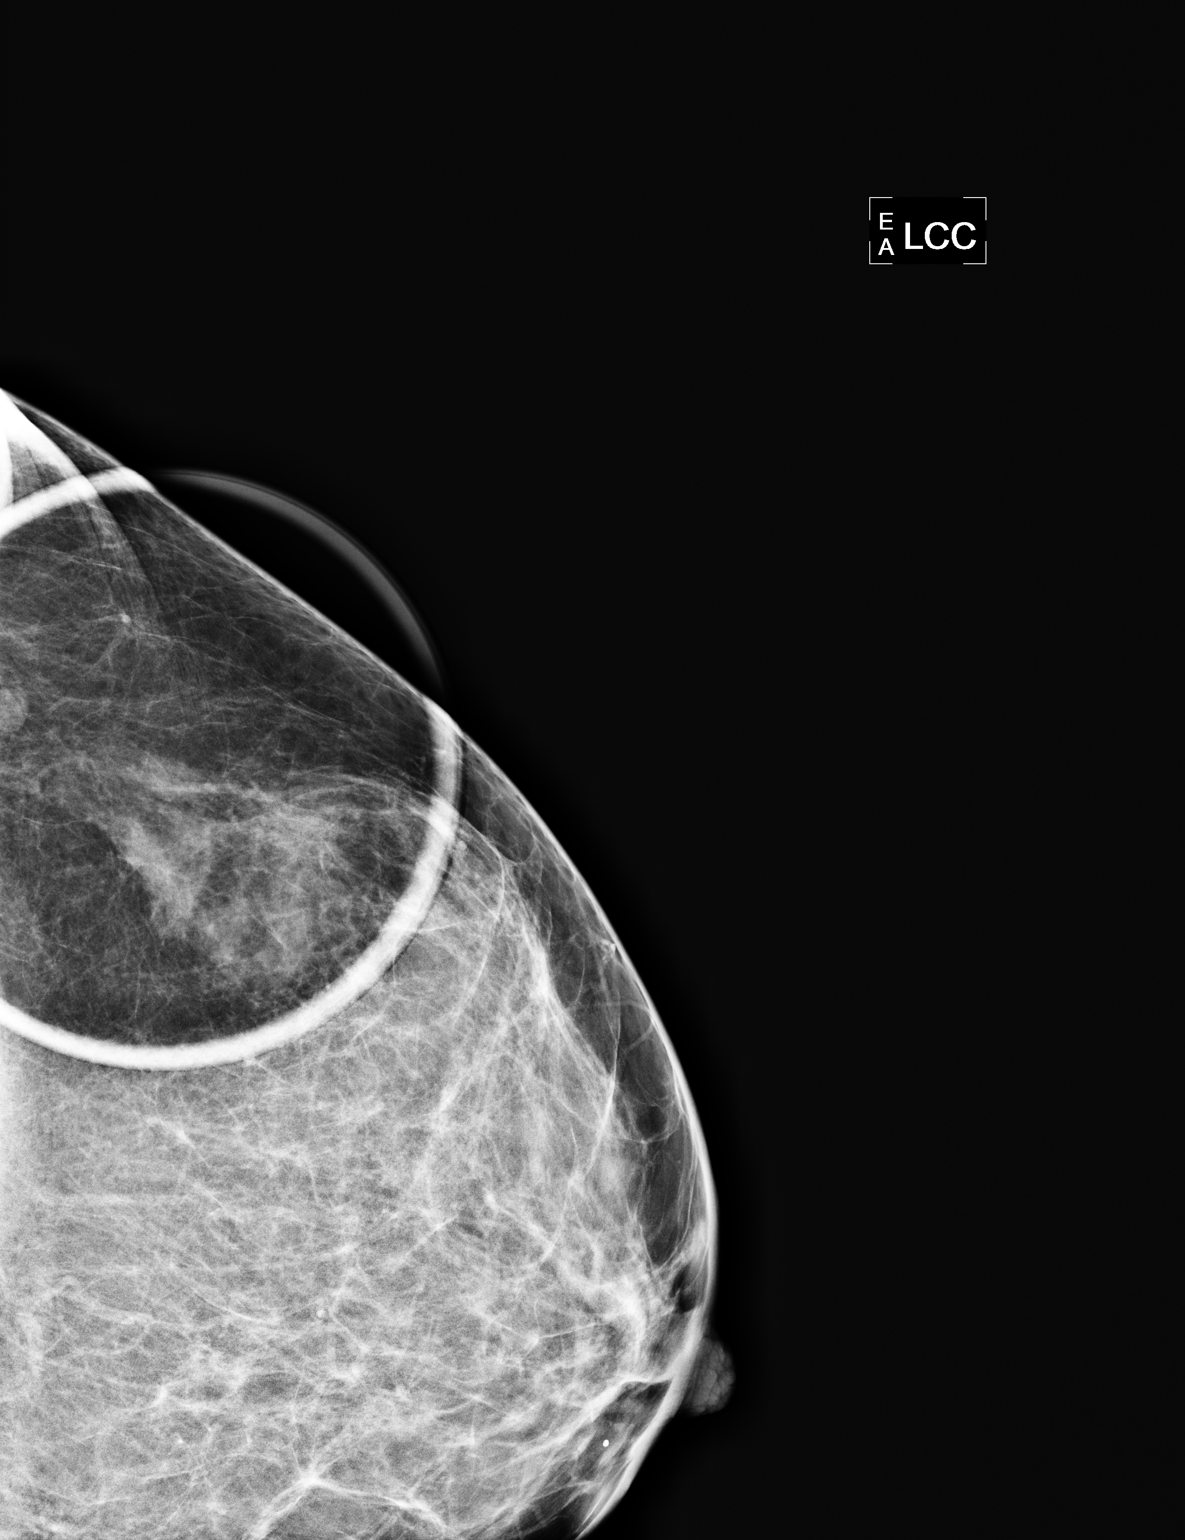

[3 of 3 positions shown; findings below may reference images not displayed]

FINDINGS: ACR Breast Density Category scattered fibroglandular tissue

Spot compression CC and MLO views of the left breast are submitted.
Previously questioned asymmetry is less prominent in the posterior
upper outer quadrant left breast.

Ultrasound is performed, showing no abnormal focal discrete cystic
or solid lesion in the upper outer quadrant left breast.
IMPRESSION: Probable benign findings.  The left breast findings are probably
related to breast changes related to patient's recent pregnancy.

RECOMMENDATION:
6-month follow-up mammogram left breast.

I have discussed the findings and recommendations with the patient.
Results were also provided in writing at the conclusion of the
visit.

BI-RADS CATEGORY 3:  Probably benign finding(s) - short interval
follow-up suggested.

## 2014-01-04 ENCOUNTER — Encounter (HOSPITAL_COMMUNITY): Payer: Self-pay

## 2016-11-06 ENCOUNTER — Ambulatory Visit (INDEPENDENT_AMBULATORY_CARE_PROVIDER_SITE_OTHER): Payer: 59

## 2016-11-06 ENCOUNTER — Ambulatory Visit (INDEPENDENT_AMBULATORY_CARE_PROVIDER_SITE_OTHER): Payer: 59 | Admitting: Sports Medicine

## 2016-11-06 ENCOUNTER — Encounter: Payer: Self-pay | Admitting: Sports Medicine

## 2016-11-06 VITALS — BP 120/80 | HR 73 | Ht 62.0 in | Wt 142.6 lb

## 2016-11-06 DIAGNOSIS — M2241 Chondromalacia patellae, right knee: Secondary | ICD-10-CM

## 2016-11-06 DIAGNOSIS — M25562 Pain in left knee: Secondary | ICD-10-CM

## 2016-11-06 DIAGNOSIS — G8929 Other chronic pain: Secondary | ICD-10-CM

## 2016-11-06 DIAGNOSIS — M25561 Pain in right knee: Secondary | ICD-10-CM

## 2016-11-06 DIAGNOSIS — M2242 Chondromalacia patellae, left knee: Secondary | ICD-10-CM | POA: Diagnosis not present

## 2016-11-06 MED ORDER — DICLOFENAC SODIUM 2 % TD SOLN: 1.0000 "application " | Freq: Two times a day (BID) | TRANSDERMAL | 2 refills | Status: DC

## 2016-11-06 MED ORDER — DICLOFENAC SODIUM 2 % TD SOLN: 1.0000 "application " | Freq: Two times a day (BID) | TRANSDERMAL | 0 refills | Status: AC

## 2016-11-06 NOTE — Assessment & Plan Note (Signed)
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  She has poor tracking of the patella with poor definition of the VMO and hip abduction strength that is less than ideal.  She also has a slight medial tilt to the trochlea and I suspect that this is contributing to early wear.  No obvious degenerative change on x-ray which is reassuring but I would like for her to work on corrective strengthening exercises with physical therapy to allow her to return to activities as tolerated.  For now she has day-to-day interference with her activities due to her symptoms but no overt mechanical symptoms and overall reassuring exam.  We will plan to have her follow-up in 8 weeks for clinical reevaluation and have her use Pennsaid intermittently.

## 2016-11-06 NOTE — Progress Notes (Signed)
OFFICE VISIT NOTE Veverly Fells. [facility] at [facility] [phone]  MAGGY WYBLE - 46 y.o. female MRN [id number]  Date of birth: 1970-09-13  Visit Date: 11/06/2016  PCP: No primary care provider on file.   Referred by: No ref. provider found  Orlie Dakin, CMA acting as scribe for Dr. Berline Chough.  SUBJECTIVE:   Chief Complaint  Patient presents with  . New Patient (Initial Visit)    RT knee pain   HPI: As below and per problem based documentation when appropriate.  Ciela is a new patient, previously seen at [facility], presenting today with complaint of RT knee pain.  She has hx of chronic bilateral knee pain, she was a Horticulturist, commercial for 20 years. Pain has been worse x 3 days. She feels cracking and popping in the knee. She has not noticed any swelling in the RT knee. Pain seems to be mostly around the patella.   The pain is described as constant aching with stabbing pains when bearing weight. Pain is rated as 7/10 when bearing weight..  Worsened with bearing weight, using stairs. Improves with rest Therapies tried include : advil, she gets minimal relief with this. She has tried icing the knee with short term relief. She noticed some relief while sitting in a hot bath.   Other associated symptoms include: Pt denies radiation of pain into legs, no hip or ankle/foot pain.   No recent xray of the knees.    Review of Systems  Constitutional: Negative for chills and fever.  Respiratory: Negative for shortness of breath and wheezing.   Cardiovascular: Negative for chest pain, palpitations and leg swelling.  Musculoskeletal: Positive for joint pain. Negative for falls.  Neurological: Positive for headaches (migraines). Negative for dizziness.  Endo/Heme/Allergies: Does not bruise/bleed easily.    Otherwise per HPI.  HISTORY & PERTINENT PRIOR DATA:  No specialty comments available. She reports that she has never smoked.  She has never used smokeless tobacco. No results for input(s): HGBA1C, LABURIC in the last 8760 hours. Medications & Allergies reviewed per EMR Patient Active Problem List   Diagnosis Date Noted  . 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 Chronic pain of both knees 11/06/2016   Past Medical History:  Diagnosis Date  . Anemia    Family History  Problem Relation Age of Onset  . Anesthesia problems Neg Hx    Past Surgical History:  Procedure Laterality Date  . EYE SURGERY    . HAND SURGERY    . TUBAL LIGATION     and reversal   Social History   Occupational History  . Not on file.   Social History Main Topics  . Smoking status: Never Smoker  . Smokeless tobacco: Never Used  . Alcohol use 0.6 oz/week    1 Glasses of wine per week  . Drug use: No  . Sexual activity: Yes    Birth control/ protection: None    OBJECTIVE:  VS:  HT:5\' 2"  (157.5 cm)   WT:142 lb 9.6 oz (64.7 kg)  BMI:26.08    BP:120/80  HR:73bpm  TEMP: ( )  RESP:99 % EXAM: Findings:  Adult female.  No acute distress.  Alert and appropriate.  Her bilateral lower extremities overall well aligned.  She has poor VMO definition of bilateral knees.  Lower extremity sensation is intact.  DP PT pulses 2+/4.  Sensation intact to light touch.  She has full flexion extension of bilateral knees.  She  has normal Park of her knees.  She has ligamentously stable knees with anterior and posterior drawer, varus and valgus strain.  Lachman's is normal.  No significant crepitation or pain with axial load and flexion-extension.  She does have pain with palpation of the medial patellar facets but this is mild.  No pain with McMurray's.  She does have some discomfort with Thessaly.  Slight medial tilt of the patella.     No results found. ASSESSMENT & PLAN:     ICD-10-CM   1. Chronic pain of both knees M25.561 DG Knee AP/LAT W/Sunrise Right   M25.562 DG Knee 1-2 Views Left   G89.29 Ambulatory referral to Physical Therapy    2. Chondromalacia, patella, right M22.41   3. Chondromalacia of left patella M22.42   ================================================================= Chronic pain of both knees Symptoms are consistent with chondromalacia patella bilaterally.  She has poor tracking of the patella with poor definition of the VMO and hip abduction strength that is less than ideal.  She also has a slight medial tilt to the trochlea and I suspect that this is contributing to early wear.  No obvious degenerative change on x-ray which is reassuring but I would like for her to work on corrective strengthening exercises with physical therapy to allow her to return to activities as tolerated.  For now she has day-to-day interference with her activities due to her symptoms but no overt mechanical symptoms and overall reassuring exam.  We will plan to have her follow-up in 8 weeks for clinical reevaluation and have her use Pennsaid intermittently. =================================================================  Follow-up: Return in about 8 weeks (around 01/01/2017) for +++Schedule PT visit with Lauren+++.   CMA/ATC served as Neurosurgeon during this visit. History, Physical, and Plan performed by medical provider. Documentation and orders reviewed and attested to.      Gaspar Bidding, DO    [facility]

## 2016-11-09 ENCOUNTER — Ambulatory Visit: Payer: 59

## 2017-01-01 ENCOUNTER — Ambulatory Visit: Payer: 59 | Admitting: Sports Medicine

## 2017-01-01 DIAGNOSIS — Z0289 Encounter for other administrative examinations: Secondary | ICD-10-CM

## 2020-01-26 ENCOUNTER — Ambulatory Visit (INDEPENDENT_AMBULATORY_CARE_PROVIDER_SITE_OTHER): Payer: Commercial Indemnity

## 2020-01-26 ENCOUNTER — Encounter: Payer: Self-pay | Admitting: Specialist

## 2020-01-26 ENCOUNTER — Ambulatory Visit: Payer: Self-pay | Admitting: Specialist

## 2020-01-26 ENCOUNTER — Other Ambulatory Visit: Payer: Self-pay

## 2020-01-26 VITALS — BP 146/89 | HR 81 | Ht 62.0 in | Wt 134.0 lb

## 2020-01-26 DIAGNOSIS — M25531 Pain in right wrist: Secondary | ICD-10-CM

## 2020-01-26 DIAGNOSIS — S52602A Unspecified fracture of lower end of left ulna, initial encounter for closed fracture: Secondary | ICD-10-CM | POA: Diagnosis not present

## 2020-01-26 MED ORDER — TRAMADOL-ACETAMINOPHEN 37.5-325 MG PO TABS: 1.0000 | ORAL_TABLET | Freq: Four times a day (QID) | ORAL | 0 refills | Status: DC | PRN

## 2020-01-26 NOTE — Patient Instructions (Signed)
 Wrist Sprain, Adult A wrist sprain is a stretch or tear in the strong, fibrous tissues (ligaments) that connect your wrist bones. There are three types of wrist sprains:  Grade 1. In this type of sprain, the ligament is stretched more than normal.  Grade 2. In this type of sprain, the ligament is partially torn. You may be able to move your wrist, but not very much.  Grade 3. In this type of sprain, the ligament or muscle is completely torn. You may find it difficult or extremely painful to move your wrist even a little. What are the causes? A wrist sprain can be caused by using the wrist too much during sports, exercise, or at work. It can also happen with a fall or during an accident. What increases the risk? This condition is more likely to occur in people:  With a previous wrist or arm injury.  With poor wrist strength and flexibility.  Who play contact sports, such as football or soccer.  Who play sports that may result in a fall, such as skateboarding, biking, skiing, or snowboarding.  Who do not exercise regularly.  Who use exercise equipment that does not fit well. What are the signs or symptoms? Symptoms of this condition include:  Pain in the wrist, arm, or hand.  Swelling or bruised skin near the wrist, hand, or arm. The skin may look yellow or kind of blue.  Stiffness or trouble moving the hand.  Hearing a pop or feeling a tear at the time of the injury.  A warm feeling in the skin around the wrist. How is this diagnosed? This condition is diagnosed with a physical exam. Sometimes an X-ray is taken to make sure a bone did not break. If your health care provider thinks that you tore a ligament, he or she may order an MRI of your wrist. How is this treated? This condition is treated by resting and applying ice to your wrist. Additional treatment may include:  Medicine for pain and inflammation.  A splint to keep your wrist still (immobilized).  Exercises to  strengthen and stretch your wrist.  Surgery. This may be done if the ligament is completely torn. Follow these instructions at home: If you have a splint:   Do not put pressure on any part of the splint until it is fully hardened. This may take several hours.  Wear the splint as told by your health care provider. Remove it only as told by your health care provider.  Loosen the splint if your fingers tingle, become numb, or turn cold and blue.  If your splint is not waterproof: ? Do not let it get wet. ? Cover it with a watertight covering when you take a bath or a shower.  Keep the splint clean. Managing pain, stiffness, and swelling   If directed, put ice on the injured area. ? If you have a removable splint, remove it as told by your health care provider. ? Put ice in a plastic bag. ? Place a towel between your skin and the bag or between the splint and the bag. ? Leave the ice on for 20 minutes, 2-3 times per day.  Move your fingers often to avoid stiffness and to lessen swelling.  Raise (elevate) the injured area above the level of your heart while you are sitting or lying down. Activity  Rest your wrist. Do not do things that cause pain.  Return to your normal activities as told by your health care   provider. Ask your health care provider what activities are safe for you.  Do exercises as told by your health care provider. General instructions  Take over-the-counter and prescription medicines only as told by your health care provider.  Do not use any products that contain nicotine or tobacco, such as cigarettes and e-cigarettes. These can delay healing. If you need help quitting, ask your health care provider.  Ask your health care provider when it is safe to drive if you have a splint.  Keep all follow-up visits as told by your health care provider. This is important. Contact a health care provider if:  Your pain, bruising, or swelling gets worse.  Your skin  becomes red, gets a rash, or has open sores.  Your pain does not get better or it gets worse. Get help right away if:  You have a new or sudden sharp pain in the hand, arm, or wrist.  You have tingling or numbness in your hand.  Your fingers turn white, very red, or cold and blue.  You cannot move your fingers. This information is not intended to replace advice given to you by your health care provider. Make sure you discuss any questions you have with your health care provider. Document Revised: 02/01/2017 Document Reviewed: 09/08/2015 Elsevier Patient Education  2020 [employer]vier Inc.  

## 2020-01-26 NOTE — Progress Notes (Signed)
Office Visit Note   Patient: Andrea Johns           Date of Birth: 10/11/1970           MRN: [id number] Visit Date: 01/26/2020              Requested by: No referring provider defined for this encounter. PCP: Mila Palmer, MD   Assessment & Plan: Visit Diagnoses:  1. Pain in right wrist   2. Closed traumatic nondisplaced fracture of distal end of left ulna, initial encounter     Plan:  Wrist Sprain, Adult A wrist sprain is a stretch or tear in the strong, fibrous tissues (ligaments) that connect your wrist bones. There are three types of wrist sprains:  Grade 1. In this type of sprain, the ligament is stretched more than normal.  Grade 2. In this type of sprain, the ligament is partially torn. You may be able to move your wrist, but not very much.  Grade 3. In this type of sprain, the ligament or muscle is completely torn. You may find it difficult or extremely painful to move your wrist even a little. What are the causes? A wrist sprain can be caused by using the wrist too much during sports, exercise, or at work. It can also happen with a fall or during an accident. What increases the risk? This condition is more likely to occur in people:  With a previous wrist or arm injury.  With poor wrist strength and flexibility.  Who play contact sports, such as football or soccer.  Who play sports that may result in a fall, such as skateboarding, biking, skiing, or snowboarding.  Who do not exercise regularly.  Who use exercise equipment that does not fit well. What are the signs or symptoms? Symptoms of this condition include:  Pain in the wrist, arm, or hand.  Swelling or bruised skin near the wrist, hand, or arm. The skin may look yellow or kind of blue.  Stiffness or trouble moving the hand.  Hearing a pop or feeling a tear at the time of the injury.  A warm feeling in the skin around the wrist. How is this diagnosed? This condition is diagnosed with a  physical exam. Sometimes an X-ray is taken to make sure a bone did not break. If your health care provider thinks that you tore a ligament, he or she may order an MRI of your wrist. How is this treated? This condition is treated by resting and applying ice to your wrist. Additional treatment may include:  Medicine for pain and inflammation.  A splint to keep your wrist still (immobilized).  Exercises to strengthen and stretch your wrist.  Surgery. This may be done if the ligament is completely torn. Follow these instructions at home: If you have a splint:   Do not put pressure on any part of the splint until it is fully hardened. This may take several hours.  Wear the splint as told by your health care provider. Remove it only as told by your health care provider.  Loosen the splint if your fingers tingle, become numb, or turn cold and blue.  If your splint is not waterproof: ? Do not let it get wet. ? Cover it with a watertight covering when you take a bath or a shower.  Keep the splint clean. Managing pain, stiffness, and swelling   If directed, put ice on the injured area. ? If you have a removable splint, remove it  as told by your health care provider. ? Put ice in a plastic bag. ? Place a towel between your skin and the bag or between the splint and the bag. ? Leave the ice on for 20 minutes, 2-3 times per day.  Move your fingers often to avoid stiffness and to lessen swelling.  Raise (elevate) the injured area above the level of your heart while you are sitting or lying down. Activity  Rest your wrist. Do not do things that cause pain.  Return to your normal activities as told by your health care provider. Ask your health care provider what activities are safe for you.  Do exercises as told by your health care provider. General instructions  Take over-the-counter and prescription medicines only as told by your health care provider.  Do not use any products that  contain nicotine or tobacco, such as cigarettes and e-cigarettes. These can delay healing. If you need help quitting, ask your health care provider.  Ask your health care provider when it is safe to drive if you have a splint.  Keep all follow-up visits as told by your health care provider. This is important. Contact a health care provider if:  Your pain, bruising, or swelling gets worse.  Your skin becomes red, gets a rash, or has open sores.  Your pain does not get better or it gets worse. Get help right away if:  You have a new or sudden sharp pain in the hand, arm, or wrist.  You have tingling or numbness in your hand.  Your fingers turn white, very red, or cold and blue.  You cannot move your fingers. This information is not intended to replace advice given to you by your health care provider. Make sure you discuss any questions you have with your health care provider. Document Revised: 02/01/2017 Document Reviewed: 09/08/2015 Elsevier Patient Education  2020 [employer].  Follow-Up Instructions: Return in about 4 weeks (around 02/23/2020).   Orders:  Orders Placed This Encounter  Procedures  . XR Wrist Complete Right   No orders of the defined types were placed in this encounter.     Procedures: No procedures performed   Clinical Data: No additional findings.   Subjective: Chief Complaint  Patient presents with  . Right Wrist - Pain    HPI  Review of Systems   Objective: Vital Signs: BP (!) 146/89   Pulse 81   Ht 5\' 2"  (1.575 m)   Wt 134 lb (60.8 kg)   BMI 24.51 kg/m   Physical Exam  Ortho Exam  Specialty Comments:  No specialty comments available.  Imaging: XR Wrist Complete Right  Result Date: 01/26/2020 AP and lateral and oblique radiographs of the right wrist demonstrate a faint hairline oblique lucent line in the right distal ulna that is at the metaphysis laterally and extends towards the Distal radioulnar joint.    PMFS  History: Patient Active Problem List   Diagnosis Date Noted  . 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 Chronic pain of both knees 11/06/2016   Past Medical History:  Diagnosis Date  . Anemia     Family History  Problem Relation Age of Onset  . Anesthesia problems Neg Hx     Past Surgical History:  Procedure Laterality Date  . EYE SURGERY    . HAND SURGERY    . TUBAL LIGATION     and reversal   Social History   Occupational History  . Not on file  Tobacco Use  .  Smoking status: Never Smoker  . Smokeless tobacco: Never Used  Substance and Sexual Activity  . Alcohol use: Yes    Alcohol/week: 1.0 standard drink    Types: 1 Glasses of wine per week  . Drug use: No  . Sexual activity: Yes    Birth control/protection: None

## 2020-02-05 ENCOUNTER — Ambulatory Visit: Payer: 59 | Admitting: Surgical

## 2020-02-22 ENCOUNTER — Other Ambulatory Visit: Payer: Self-pay

## 2020-02-22 ENCOUNTER — Ambulatory Visit (INDEPENDENT_AMBULATORY_CARE_PROVIDER_SITE_OTHER): Payer: Commercial Indemnity

## 2020-02-22 ENCOUNTER — Encounter: Payer: Self-pay | Admitting: Specialist

## 2020-02-22 ENCOUNTER — Ambulatory Visit (INDEPENDENT_AMBULATORY_CARE_PROVIDER_SITE_OTHER): Payer: Commercial Indemnity | Admitting: Specialist

## 2020-02-22 VITALS — BP 144/86 | HR 73 | Ht 62.0 in | Wt 134.0 lb

## 2020-02-22 DIAGNOSIS — S52602A Unspecified fracture of lower end of left ulna, initial encounter for closed fracture: Secondary | ICD-10-CM | POA: Diagnosis not present

## 2020-02-22 DIAGNOSIS — S63591S Other specified sprain of right wrist, sequela: Secondary | ICD-10-CM

## 2020-02-22 DIAGNOSIS — M25531 Pain in right wrist: Secondary | ICD-10-CM
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
## 2020-02-22 NOTE — Patient Instructions (Signed)
Right wrist splint.  MRI right wrist to assess for Triangular fibrocartilage complex tear vs stress fracture.

## 2020-03-03 ENCOUNTER — Ambulatory Visit: Payer: Commercial Indemnity | Admitting: Specialist

## 2020-03-16 ENCOUNTER — Other Ambulatory Visit: Payer: Self-pay

## 2020-03-16 ENCOUNTER — Ambulatory Visit
Admission: RE | Admit: 2020-03-16 | Discharge: 2020-03-16 | Disposition: A | Discharge status: Home / Self Care | Payer: Commercial Indemnity | Source: Ambulatory Visit | Attending: Specialist | Admitting: Specialist

## 2020-03-16 DIAGNOSIS — M25531 Pain in right wrist: Secondary | ICD-10-CM

## 2020-03-16 DIAGNOSIS — S52602A Unspecified fracture of lower end of left ulna, initial encounter for closed fracture: Secondary | ICD-10-CM

## 2020-03-16 DIAGNOSIS — S63591S Other specified sprain of right wrist, sequela: Secondary | ICD-10-CM

## 2020-03-18 ENCOUNTER — Other Ambulatory Visit: Payer: Self-pay | Admitting: Specialist

## 2020-03-18 ENCOUNTER — Other Ambulatory Visit: Payer: Self-pay | Admitting: Radiology

## 2020-03-18 DIAGNOSIS — M25531 Pain in right wrist: Secondary | ICD-10-CM

## 2020-03-18 DIAGNOSIS — S52602A Unspecified fracture of lower end of left ulna, initial encounter for closed fracture: Secondary | ICD-10-CM

## 2020-03-21 ENCOUNTER — Ambulatory Visit: Payer: Commercial Indemnity | Admitting: Specialist

## 2021-12-24 ENCOUNTER — Encounter (HOSPITAL_BASED_OUTPATIENT_CLINIC_OR_DEPARTMENT_OTHER): Payer: Self-pay | Admitting: Emergency Medicine

## 2021-12-24 ENCOUNTER — Emergency Department (HOSPITAL_BASED_OUTPATIENT_CLINIC_OR_DEPARTMENT_OTHER)
Admission: EM | Admit: 2021-12-24 | Discharge: 2021-12-24 | Disposition: A | Discharge status: Home / Self Care | Payer: Commercial Indemnity | Attending: Emergency Medicine | Admitting: Emergency Medicine

## 2021-12-24 ENCOUNTER — Other Ambulatory Visit: Payer: Self-pay

## 2021-12-24 DIAGNOSIS — W268XXA Contact with other sharp object(s), not elsewhere classified, initial encounter: Secondary | ICD-10-CM | POA: Insufficient documentation

## 2021-12-24 DIAGNOSIS — S61012A Laceration without foreign body of left thumb without damage to nail, initial encounter: Secondary | ICD-10-CM

## 2021-12-24 DIAGNOSIS — Z23 Encounter for immunization: Secondary | ICD-10-CM | POA: Insufficient documentation

## 2021-12-24 MED ORDER — CEPHALEXIN 250 MG PO CAPS: 500.0000 mg | ORAL_CAPSULE | Freq: Once | ORAL | Status: DC

## 2021-12-24 MED ORDER — TETANUS-DIPHTH-ACELL PERTUSSIS 5-2.5-18.5 LF-MCG/0.5 IM SUSY
0.5000 mL | PREFILLED_SYRINGE | Freq: Once | INTRAMUSCULAR | Status: AC
  Administered 2021-12-24: 0.5 mL via INTRAMUSCULAR
  Filled 2021-12-24: qty 0.5

## 2021-12-24 MED ORDER — CEPHALEXIN 500 MG PO CAPS: 500.0000 mg | ORAL_CAPSULE | Freq: Four times a day (QID) | ORAL | 0 refills | Status: DC

## 2021-12-24 NOTE — ED Provider Notes (Signed)
[facility] EMERGENCY DEPT Provider Note   CSN: [id number] Arrival date & time: 12/24/21  1753     History  Chief Complaint  Patient presents with   Extremity Laceration    Andrea Johns is a 51 y.o. female.  Patient is a 51 year old right-hand-dominant female presenting to the emergency department with a left thumb laceration.  Patient states around 5:00 PM she was cleaning her mixer when she excellently cut her left thumb.  She states that she cleaned the wound with Neosporin and her family member repaired the wound with superglue and then she came to the emergency department.  She states initially it had significant bleeding but after the glue the bleeding stopped.  She denies any numbness or weakness.  She denies any possibility of foreign body into the wound.  She is unsure when her tetanus was last updated.  The history is provided by the patient.       Home Medications Prior to Admission medications   Medication Sig Start Date End Date Taking? Authorizing Provider  cephALEXin (KEFLEX) 500 MG capsule Take 1 capsule (500 mg total) by mouth 4 (four) times daily. 12/24/21  Yes Maylon Peppers, [place] K, DO  Diclofenac Sodium (PENNSAID) 2 % SOLN Place 1 application onto the skin 2 (two) times daily. 11/06/16   Gerda Diss, DO  ibuprofen (ADVIL,MOTRIN) 200 MG tablet Take 200 mg by mouth every 6 (six) hours as needed.    [provider]  traMADol-acetaminophen (ULTRACET) 37.5-325 MG tablet Take 1 tablet by mouth every 6 (six) hours as needed. 01/26/20   Jessy Oto, MD      Allergies    Ciprofloxacin and Codeine    Review of Systems   Review of Systems  Physical Exam Updated Vital Signs BP 137/85   Pulse 79   Temp 98.4 F (36.9 C)   Resp 16   Ht 5\' 2"  (4.650 m)   Wt 62.1 kg   LMP 11/24/2021 (Approximate)   SpO2 97%   BMI 25.06 kg/m  Physical Exam Vitals and nursing note reviewed.  Constitutional:      General: She is not in acute  distress.    Appearance: Normal appearance.  HENT:     Head: Normocephalic and atraumatic.     Nose: Nose normal.     Mouth/Throat:     Mouth: Mucous membranes are moist.     Pharynx: Oropharynx is clear.  Eyes:     Conjunctiva/sclera: Conjunctivae normal.  Cardiovascular:     Pulses: Normal pulses.  Pulmonary:     Effort: Pulmonary effort is normal.  Abdominal:     General: Abdomen is flat.  Musculoskeletal:        General: Normal range of motion.     Comments: Full flexion/extension of DIP and MCP of left thumb as well as abduction and abduction of the thumb No bony tenderness to left hand or wrist  Skin:    General: Skin is warm and dry.     Comments: Proximately 1 cm laceration on the palmar aspect overlying the left MCP joint with overlying superglue.  No current gaping or active bleeding or drainage no surrounding erythema or warmth  Neurological:     General: No focal deficit present.     Mental Status: She is alert and oriented to person, place, and time.     Sensory: No sensory deficit.     Motor: No weakness.  Psychiatric:        Mood and Affect:  Mood normal.        Behavior: Behavior normal.     ED Results / Procedures / Treatments   Labs (all labs ordered are listed, but only abnormal results are displayed) Labs Reviewed - No data to display  EKG None  Radiology No results found.  Procedures Procedures    Medications Ordered in ED Medications  cephALEXin (KEFLEX) capsule 500 mg (has no administration in time range)  Tdap (BOOSTRIX) injection 0.5 mL (0.5 mLs Intramuscular Given 12/24/21 2206)    ED Course/ Medical Decision Making/ A&P Clinical Course as of 12/24/21 2259  Sun Dec 24, 2021  2258 After irrigation, the patient has no gaping of the wound that requires any laceration repair.  Due to concern for inadequate irrigation prior to superglue in the wound, she will be treated with prophylactic antibiotics.  She was recommended primary care  follow-up and given strict return precautions. [VK]    Clinical Course User Index [VK] Rexford Maus, DO                           Medical Decision Making This patient presents to the ED with chief complaint(s) of finger laceration with no pertinent past medical history which further complicates the presenting complaint. The complaint involves an extensive differential diagnosis and also carries with it a high risk of complications and morbidity.    The differential diagnosis includes laceration, no evidence of ligamentous or tendinous injury, no evidence of neurovascular injury, no bony tenderness making fracture dislocation unlikely, low concern for foreign body  Additional history obtained: Additional history obtained from family Records reviewed N/A  ED Course and Reassessment: Upon patient's arrival to the emergency department her wound has been super glued shut and is not gaping or currently bleeding.  No concern for foreign body.  Patient is unsure when her tetanus was last updated so this will be updated today.  Plan will be to attempt to irrigate the wound.  We have no acetone in the emergency department to attempt to remove the superglue some concern for insufficient irrigation and risk of infection.  Independent labs interpretation:  N/A  Independent visualization of imaging: N/A  Consultation: - Consulted or discussed management/test interpretation w/ external professional: N/A  Consideration for admission or further workup: Social Determinants of health:    Risk Prescription drug management.          Final Clinical Impression(s) / ED Diagnoses Final diagnoses:  Laceration of left thumb without foreign body without damage to nail, initial encounter    Rx / DC Orders ED Discharge Orders          Ordered    cephALEXin (KEFLEX) 500 MG capsule  4 times daily        12/24/21 2258              Rexford Maus, DO 12/24/21 2259

## 2021-12-24 NOTE — Discharge Instructions (Signed)
You were seen in the emergency department for your finger laceration.  Due to it being closed shut it was not gaping in the ER that needed any stitches.  Because we were not able to adequately irrigated it prior to gluing it close, I have given you prophylactic antibiotics to prevent infection.  You should follow-up with your primary doctor to have your wound rechecked in the next 2 to 3 days.  We updated your tetanus for you today.  You should return to the emergency department if you are having increasing redness or swelling of your thumb, pus draining from her thumb, you are unable to bend her thumb you have fevers or if you have any other new or concerning symptoms.

## 2021-12-24 NOTE — ED Triage Notes (Signed)
Pt via pov from home with laceration to left thumb. Pt's relative glued it but it is still a large cut. Pt alert & oriented, nad noted.

## 2022-03-06 ENCOUNTER — Other Ambulatory Visit: Payer: Self-pay | Admitting: Family Medicine

## 2022-03-06 DIAGNOSIS — M545 Low back pain, unspecified: Secondary | ICD-10-CM

## 2022-04-23 ENCOUNTER — Other Ambulatory Visit: Payer: Self-pay | Admitting: Obstetrics and Gynecology

## 2022-04-23 DIAGNOSIS — R928 Other abnormal and inconclusive findings on diagnostic imaging of breast: Secondary | ICD-10-CM

## 2022-05-07 ENCOUNTER — Ambulatory Visit
Admission: RE | Admit: 2022-05-07 | Discharge: 2022-05-07 | Disposition: A | Discharge status: Home / Self Care | Payer: 59 | Source: Ambulatory Visit | Attending: Obstetrics and Gynecology | Admitting: Obstetrics and Gynecology

## 2022-05-07 ENCOUNTER — Other Ambulatory Visit: Payer: Self-pay | Admitting: Obstetrics and Gynecology

## 2022-05-07 ENCOUNTER — Ambulatory Visit
Admission: RE | Admit: 2022-05-07 | Discharge: 2022-05-07 | Disposition: A | Discharge status: Home / Self Care | Payer: Self-pay | Source: Ambulatory Visit | Attending: Obstetrics and Gynecology | Admitting: Obstetrics and Gynecology

## 2022-05-07 DIAGNOSIS — R928 Other abnormal and inconclusive findings on diagnostic imaging of breast: Secondary | ICD-10-CM

## 2022-05-07 DIAGNOSIS — N6310 Unspecified lump in the right breast, unspecified quadrant: Secondary | ICD-10-CM

## 2022-05-17 ENCOUNTER — Ambulatory Visit
Admission: RE | Admit: 2022-05-17 | Discharge: 2022-05-17 | Disposition: A | Discharge status: Home / Self Care | Payer: 59 | Source: Ambulatory Visit | Attending: Obstetrics and Gynecology | Admitting: Obstetrics and Gynecology

## 2022-05-17 DIAGNOSIS — N6310 Unspecified lump in the right breast, unspecified quadrant: Secondary | ICD-10-CM

## 2022-05-17 DIAGNOSIS — R928 Other abnormal and inconclusive findings on diagnostic imaging of breast: Secondary | ICD-10-CM

## 2022-05-17 HISTORY — PX: BREAST BIOPSY: SHX20

## 2022-05-18 ENCOUNTER — Telehealth: Payer: Self-pay | Admitting: Hematology and Oncology

## 2022-05-18 NOTE — Telephone Encounter (Signed)
Spoke to patient to confirm upcoming afternoon [facility] clinic appointment on 3/20, paperwork will be sent via mail.   Gave location and time, also informed patient that the surgeon's office would be calling as well to get information from them similar to the packet that they will be receiving so make sure to do both.  Reminded patient that all providers will be coming to the clinic to see them HERE and if they had any questions to not hesitate to reach back out to myself or their navigators.

## 2022-05-21 DIAGNOSIS — C50811 Malignant neoplasm of overlapping sites of right female breast: Secondary | ICD-10-CM | POA: Insufficient documentation

## 2022-05-21 DIAGNOSIS — Z170 Estrogen receptor positive status [ER+]: Secondary | ICD-10-CM | POA: Insufficient documentation

## 2022-05-21 NOTE — Progress Notes (Signed)
Radiation Oncology         [phone] ________________________________  Name: Andrea Johns        MRN: [id number]  Date of Service: 05/23/2022 DOB: Jul 06, 1970  CC:Jonathon Jordan, MD  Stark Klein, MD     REFERRING PHYSICIAN: Stark Klein, MD   DIAGNOSIS: There were no encounter diagnoses.   HISTORY OF PRESENT ILLNESS: Andrea Johns is a 52 y.o. female seen in the multidisciplinary breast clinic for a new diagnosis of right breast cancer. The patient had screening detected right breast asymmetry and subsequently returned for diagnostic imaging on 05/07/2022.  By spot compression and additional mammography, there was concern for persistent irregular asymmetry with associated amorphous calcifications.  By ultrasound in the 12 o'clock position there was a 7 mm mass 3 cm from the nipple corresponding with the asymmetry and no additional asymmetry in the posterior medial breast was noted.  Her axilla was negative for adenopathy.  She underwent ultrasound-guided biopsy of the lesion at 12:00 and stereotactic biopsy of the asymmetry mammographically on 05/17/2022.  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  The medial breast specimen showed benign breast tissue with fibrocystic change and mildly increased vascularity.  Her prognostic panel showed that the tumor was***. She is seen today to discuss treatment recommendations of her cancer.    PREVIOUS RADIATION THERAPY: {EXAM; YES/NO:19492::"No"}   PAST MEDICAL HISTORY:  Past Medical History:  Diagnosis Date   Anemia        PAST SURGICAL HISTORY: Past Surgical History:  Procedure Laterality Date   BREAST BIOPSY Right 05/17/2022   [place] RT BREAST BX W LOC DEV 1ST LESION IMG BX SPEC US GUIDE 05/17/2022 [facility]   BREAST BIOPSY Right 05/17/2022   MM RT BREAST BX W LOC DEV 1ST LESION IMAGE BX SPEC STEREO GUIDE 05/17/2022 [facility]   EYE SURGERY     HAND SURGERY      TUBAL LIGATION     and reversal     FAMILY HISTORY:  Family History  Problem Relation Age of Onset   Breast cancer Maternal Aunt    Anesthesia problems Neg Hx      SOCIAL HISTORY:  reports that she has never smoked. She has never used smokeless tobacco. She reports current alcohol use of about 1.0 standard drink of alcohol per week. She reports that she does not use drugs.  The patient is married and lives in [place].  She***   ALLERGIES: Ciprofloxacin and Codeine   MEDICATIONS:  Current Outpatient Medications  Medication Sig Dispense Refill   cephALEXin (KEFLEX) 500 MG capsule Take 1 capsule (500 mg total) by mouth 4 (four) times daily. 20 capsule 0   Diclofenac Sodium (PENNSAID) 2 % SOLN Place 1 application onto the skin 2 (two) times daily. 112 g 2   ibuprofen (ADVIL,MOTRIN) 200 MG tablet Take 200 mg by mouth every 6 (six) hours as needed.     traMADol-acetaminophen (ULTRACET) 37.5-325 MG tablet Take 1 tablet by mouth every 6 (six) hours as needed. 30 tablet 0   No current facility-administered medications for this visit.     REVIEW OF SYSTEMS: On review of systems, the patient reports that she is doing ***     PHYSICAL EXAM:  Wt Readings from Last 3 Encounters:  12/24/21 137 lb (62.1 kg)  02/22/20 134 lb (60.8 kg)  01/26/20 134 lb (60.8 kg)   Temp Readings from Last 3 Encounters:  12/24/21 98.4  F (36.9 C) (Oral)  03/14/12 98.3 F (36.8 C) (Oral)  03/06/12 98.1 F (36.7 C) (Oral)   BP Readings from Last 3 Encounters:  12/24/21 134/82  02/22/20 (!) 144/86  01/26/20 (!) 146/89   Pulse Readings from Last 3 Encounters:  12/24/21 80  02/22/20 73  01/26/20 81    In general this is a well appearing *** female in no acute distress. She's alert and oriented x4 and appropriate throughout the examination. Cardiopulmonary assessment is negative for acute distress and she exhibits normal effort. Bilateral breast exam is deferred.    ECOG = ***  0 -  Asymptomatic (Fully active, able to carry on all predisease activities without restriction)  1 - Symptomatic but completely ambulatory (Restricted in physically strenuous activity but ambulatory and able to carry out work of a light or sedentary nature. For example, light housework, office work)  2 - Symptomatic, <50% in bed during the day (Ambulatory and capable of all self care but unable to carry out any work activities. Up and about more than 50% of waking hours)  3 - Symptomatic, >50% in bed, but not bedbound (Capable of only limited self-care, confined to bed or chair 50% or more of waking hours)  4 - Bedbound (Completely disabled. Cannot carry on any self-care. Totally confined to bed or chair)  5 - Death   Eustace Pen MM, Creech RH, Tormey DC, et al. [phone]). "Toxicity and response criteria of the [facility]". [place] Oncol. 5 (6): 649-55    LABORATORY DATA:  Lab Results  Component Value Date   WBC 11.1 (H) 02/22/2012   HGB 12.8 02/22/2012   HCT 38.3 02/22/2012   MCV 87.2 02/22/2012   PLT 467 (H) 02/22/2012   No results found for: "NA", "K", "CL", "CO2" Lab Results  Component Value Date   AST 18 02/22/2012      RADIOGRAPHY: MM CLIP PLACEMENT RIGHT  Result Date: 05/17/2022 CLINICAL DATA:  Status post ultrasound biopsy and stereo biopsy right breast EXAM: 3D DIAGNOSTIC RIGHT MAMMOGRAM POST ULTRASOUND and stereotactic BIOPSY COMPARISON:  Previous exam(s). FINDINGS: 3D Mammographic images were obtained following ultrasound and stereotactic biopsies right breast. Site 1: Right breast mass 12 o'clock position: Ribbon shaped clip: In appropriate position. Site 2: Right breast asymmetry medially: X shaped clip: In appropriate position. IMPRESSION: Appropriate positioning of the biopsy marking clips as above. Final Assessment: Post Procedure Mammograms for Marker Placement Electronically Signed   By: Lovey Newcomer M.D.   On: 05/17/2022 10:39  MM RT BREAST BX W  LOC DEV 1ST LESION IMAGE BX SPEC STEREO GUIDE  Result Date: 05/17/2022 CLINICAL DATA:  Indeterminate right breast asymmetry. EXAM: RIGHT BREAST STEREOTACTIC CORE NEEDLE BIOPSY COMPARISON:  Previous exam(s). FINDINGS: The patient and I discussed the procedure of stereotactic-guided biopsy including benefits and alternatives. We discussed the high likelihood of a successful procedure. We discussed the risks of the procedure including infection, bleeding, tissue injury, clip migration, and inadequate sampling. Informed written consent was given. The usual time out protocol was performed immediately prior to the procedure. Using sterile technique and 1% Lidocaine as local anesthetic, under stereotactic guidance, a 9 gauge vacuum assisted device was used to perform core needle biopsy of asymmetry medial right breast using a cranial approach. Lesion quadrant: Upper inner quadrant At the conclusion of the procedure, X shaped tissue marker clip was deployed into the biopsy cavity. Follow-up 2-view mammogram was performed and dictated separately. IMPRESSION: Stereotactic-guided biopsy of right breast asymmetry. No apparent  complications. Electronically Signed   By: Lovey Newcomer M.D.   On: 05/17/2022 10:32  [place] RT BREAST BX W LOC DEV 1ST LESION IMG BX SPEC US GUIDE  Result Date: 05/17/2022 CLINICAL DATA:  Patient with suspicious right breast mass 12 o'clock position EXAM: ULTRASOUND GUIDED RIGHT BREAST CORE NEEDLE BIOPSY COMPARISON:  Previous exam(s). PROCEDURE: I met with the patient and we discussed the procedure of ultrasound-guided biopsy, including benefits and alternatives. We discussed the high likelihood of a successful procedure. We discussed the risks of the procedure, including infection, bleeding, tissue injury, clip migration, and inadequate sampling. Informed written consent was given. The usual time-out protocol was performed immediately prior to the procedure. Lesion quadrant: Upper inner quadrant Using  sterile technique and 1% Lidocaine as local anesthetic, under direct ultrasound visualization, a 14 gauge spring-loaded device was used to perform biopsy of right breast mass 12 o'clock position using a lateral approach. At the conclusion of the procedure ribbon shaped tissue marker clip was deployed into the biopsy cavity. Follow up 2 view mammogram was performed and dictated separately. IMPRESSION: Ultrasound guided biopsy of right breast mass 12 o'clock position. No apparent complications. Electronically Signed   By: Lovey Newcomer M.D.   On: 05/17/2022 10:28  MM DIAG BREAST TOMO UNI RIGHT  Result Date: 05/07/2022 CLINICAL DATA:  Patient recalled from screening for right breast asymmetries. EXAM: DIGITAL DIAGNOSTIC UNILATERAL RIGHT MAMMOGRAM WITH TOMOSYNTHESIS; ULTRASOUND RIGHT BREAST LIMITED TECHNIQUE: Right digital diagnostic mammography and breast tomosynthesis was performed.; Targeted ultrasound examination of the right breast was performed COMPARISON:  Previous exam(s). ACR Breast Density Category c: The breasts are heterogeneously dense, which may obscure small masses. FINDINGS: Within the anterior right breast there is a persistent irregular asymmetry with associated amorphous calcifications, further evaluated with spot compression and magnification views. Within the medial right breast middle to posterior depth there is a persistent focal asymmetry demonstrated best on the spot cc views. Targeted ultrasound is performed, showing a 7 x 7 x 6 mm irregular hypoechoic mass right breast 12 o'clock position 3 cm from nipple corresponding with the asymmetry within the anterior right breast with associated calcifications. No definite sonographic correlate for the asymmetry within the posteromedial right breast. No right axillary adenopathy. IMPRESSION: Suspicious right breast mass 12 o'clock position. Indeterminate sonographically occult asymmetry within the medial posterior right breast. RECOMMENDATION:  Ultrasound-guided core needle biopsy right breast mass 12 o'clock position. Stereotactic guided core needle biopsy sonographically occult asymmetry within the medial posterior right breast demonstrated best on the spot cc view. I have discussed the findings and recommendations with the patient. If applicable, a reminder letter will be sent to the patient regarding the next appointment. BI-RADS CATEGORY  4: Suspicious. Electronically Signed   By: Lovey Newcomer M.D.   On: 05/07/2022 14:02  US BREAST LTD UNI RIGHT INC AXILLA  Result Date: 05/07/2022 CLINICAL DATA:  Patient recalled from screening for right breast asymmetries. EXAM: DIGITAL DIAGNOSTIC UNILATERAL RIGHT MAMMOGRAM WITH TOMOSYNTHESIS; ULTRASOUND RIGHT BREAST LIMITED TECHNIQUE: Right digital diagnostic mammography and breast tomosynthesis was performed.; Targeted ultrasound examination of the right breast was performed COMPARISON:  Previous exam(s). ACR Breast Density Category c: The breasts are heterogeneously dense, which may obscure small masses. FINDINGS: Within the anterior right breast there is a persistent irregular asymmetry with associated amorphous calcifications, further evaluated with spot compression and magnification views. Within the medial right breast middle to posterior depth there is a persistent focal asymmetry demonstrated best on the spot cc views. Targeted ultrasound is  performed, showing a 7 x 7 x 6 mm irregular hypoechoic mass right breast 12 o'clock position 3 cm from nipple corresponding with the asymmetry within the anterior right breast with associated calcifications. No definite sonographic correlate for the asymmetry within the posteromedial right breast. No right axillary adenopathy. IMPRESSION: Suspicious right breast mass 12 o'clock position. Indeterminate sonographically occult asymmetry within the medial posterior right breast. RECOMMENDATION: Ultrasound-guided core needle biopsy right breast mass 12 o'clock position.  Stereotactic guided core needle biopsy sonographically occult asymmetry within the medial posterior right breast demonstrated best on the spot cc view. I have discussed the findings and recommendations with the patient. If applicable, a reminder letter will be sent to the patient regarding the next appointment. BI-RADS CATEGORY  4: Suspicious. Electronically Signed   By: Lovey Newcomer M.D.   On: 05/07/2022 14:02      IMPRESSION/PLAN: 1. 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 Dr. Lisbeth Renshaw discusses the pathology findings and reviews the nature of early stage right breast disease. The consensus from the breast conference includes breast conservation with lumpectomy with sentinel node biopsy. Depending on the size of the final tumor measurements rendered by pathology, the tumor may be tested for Oncotype Dx score to determine a role for systemic therapy. Provided that chemotherapy is not indicated, the patient's course would then be followed by external radiotherapy to the breast  to reduce risks of local recurrence followed by antiestrogen therapy. We discussed the risks, benefits, short, and long term effects of radiotherapy, as well as the curative intent, and the patient is interested in proceeding. Dr. Lisbeth Renshaw discusses the delivery and logistics of radiotherapy and anticipates a course of 4 or up to 6 1/2 weeks of radiotherapy to the right breast. We will see her back a few weeks after surgery to discuss the simulation process and anticipate we starting radiotherapy about 4-6 weeks after surgery.  2. Possible genetic predisposition to malignancy. The patient is a candidate for genetic testing given her personal and family history. She will meet with our geneticist today in clinic.   In a visit lasting *** minutes, greater than 50% of the time was spent face to face reviewing her case, as well as in preparation of, discussing, and coordinating the patient's  care.  The above documentation reflects my direct findings during this shared patient visit. Please see the separate note by Dr. Lisbeth Renshaw on this date for the remainder of the patient's plan of care.  ***    **Disclaimer: This note was dictated with voice recognition software. Similar sounding words can inadvertently be transcribed and this note may contain transcription errors which may not have been corrected upon publication of note.**

## 2022-05-22 ENCOUNTER — Other Ambulatory Visit: Payer: Self-pay | Admitting: *Deleted

## 2022-05-22 ENCOUNTER — Encounter: Payer: Self-pay | Admitting: *Deleted

## 2022-05-22 DIAGNOSIS — Z170 Estrogen receptor positive status [ER+]: Secondary | ICD-10-CM

## 2022-05-23 ENCOUNTER — Ambulatory Visit: Payer: Self-pay | Admitting: Surgery

## 2022-05-23 ENCOUNTER — Encounter: Payer: Self-pay | Admitting: *Deleted

## 2022-05-23 ENCOUNTER — Encounter: Payer: Self-pay | Admitting: Radiology

## 2022-05-23 ENCOUNTER — Inpatient Hospital Stay: Payer: 59

## 2022-05-23 ENCOUNTER — Other Ambulatory Visit: Payer: Self-pay

## 2022-05-23 ENCOUNTER — Encounter: Payer: Self-pay | Admitting: General Practice

## 2022-05-23 ENCOUNTER — Inpatient Hospital Stay (HOSPITAL_BASED_OUTPATIENT_CLINIC_OR_DEPARTMENT_OTHER): Payer: 59 | Admitting: Genetic Counselor

## 2022-05-23 ENCOUNTER — Ambulatory Visit: Payer: 59 | Attending: Surgery | Admitting: Physical Therapy

## 2022-05-23 ENCOUNTER — Inpatient Hospital Stay: Payer: 59 | Attending: Hematology and Oncology | Admitting: Hematology and Oncology

## 2022-05-23 ENCOUNTER — Ambulatory Visit
Admission: RE | Admit: 2022-05-23 | Discharge: 2022-05-23 | Disposition: A | Discharge status: Home / Self Care | Payer: 59 | Source: Ambulatory Visit | Attending: Radiation Oncology | Admitting: Radiation Oncology

## 2022-05-23 VITALS — BP 155/79 | HR 80 | Temp 97.7°F | Resp 18 | Ht 62.0 in | Wt 139.6 lb

## 2022-05-23 DIAGNOSIS — Z170 Estrogen receptor positive status [ER+]: Secondary | ICD-10-CM | POA: Insufficient documentation

## 2022-05-23 DIAGNOSIS — C50411 Malignant neoplasm of upper-outer quadrant of right female breast: Secondary | ICD-10-CM

## 2022-05-23 DIAGNOSIS — Z5189 Encounter for other specified aftercare: Secondary | ICD-10-CM | POA: Diagnosis not present

## 2022-05-23 DIAGNOSIS — Z803 Family history of malignant neoplasm of breast: Secondary | ICD-10-CM | POA: Insufficient documentation

## 2022-05-23 DIAGNOSIS — C50811 Malignant neoplasm of overlapping sites of right female breast: Secondary | ICD-10-CM

## 2022-05-23 DIAGNOSIS — R293 Abnormal posture: Secondary | ICD-10-CM

## 2022-05-23 DIAGNOSIS — C50911 Malignant neoplasm of unspecified site of right female breast: Secondary | ICD-10-CM

## 2022-05-23 LAB — CBC WITH DIFFERENTIAL (CANCER CENTER ONLY)
Abs Immature Granulocytes: 0.02 10*3/uL (ref 0.00–0.07)
Basophils Absolute: 0 10*3/uL (ref 0.0–0.1)
Basophils Relative: 0 %
Eosinophils Absolute: 0.1 10*3/uL (ref 0.0–0.5)
Eosinophils Relative: 1 %
HCT: 40.4 % (ref 36.0–46.0)
Hemoglobin: 13.4 g/dL (ref 12.0–15.0)
Immature Granulocytes: 0 %
Lymphocytes Relative: 21 %
Lymphs Abs: 1.6 10*3/uL (ref 0.7–4.0)
MCH: 29.8 pg (ref 26.0–34.0)
MCHC: 33.2 g/dL (ref 30.0–36.0)
MCV: 89.8 fL (ref 80.0–100.0)
Monocytes Absolute: 0.5 10*3/uL (ref 0.1–1.0)
Monocytes Relative: 7 %
Neutro Abs: 5.4 10*3/uL (ref 1.7–7.7)
Neutrophils Relative %: 71 %
Platelet Count: 456 10*3/uL — ABNORMAL HIGH (ref 150–400)
RBC: 4.5 MIL/uL (ref 3.87–5.11)
RDW: 13.4 % (ref 11.5–15.5)
WBC Count: 7.6 10*3/uL (ref 4.0–10.5)
nRBC: 0 % (ref 0.0–0.2)

## 2022-05-23 LAB — CMP (CANCER CENTER ONLY)
ALT: 60 U/L — ABNORMAL HIGH (ref 0–44)
AST: 44 U/L — ABNORMAL HIGH (ref 15–41)
Albumin: 4.1 g/dL (ref 3.5–5.0)
Alkaline Phosphatase: 65 U/L (ref 38–126)
Anion gap: 6 (ref 5–15)
BUN: 7 mg/dL (ref 6–20)
CO2: 28 mmol/L (ref 22–32)
Calcium: 9.4 mg/dL (ref 8.9–10.3)
Chloride: 103 mmol/L (ref 98–111)
Creatinine: 0.69 mg/dL (ref 0.44–1.00)
GFR, Estimated: >60 mL/min (ref >60)
Glucose, Bld: 106 mg/dL — ABNORMAL HIGH (ref 70–99)
Potassium: 4 mmol/L (ref 3.5–5.1)
Sodium: 137 mmol/L (ref 135–145)
Total Bilirubin: 0.4 mg/dL (ref 0.3–1.2)
Total Protein: 6.7 g/dL (ref 6.5–8.1)

## 2022-05-23 LAB — GENETIC SCREENING ORDER

## 2022-05-23 NOTE — Progress Notes (Signed)
[facility] Psychosocial Distress Screening Spiritual Care  Met with Andrea Johns and her husband [name] in [facility] to introduce [facility] team/resources, reviewing distress screen per protocol.  The patient scored a 2 on the Psychosocial Distress Thermometer which indicates mild distress. Also assessed for distress and other psychosocial needs.      05/23/2022    4:13 PM  ONCBCN DISTRESS SCREENING  Screening Type Initial Screening  Distress experienced in past week (1-10) 2  Emotional problem type Nervousness/Anxiety;Adjusting to illness  Physical Problem type Sleep/insomnia;Changes in urination  Referral to support programs Yes    Chaplain and patient discussed common feelings and emotions when being diagnosed with cancer, and the importance of support during treatment.  Chaplain informed patient of the support team and support services at [facility].  Chaplain provided contact information and encouraged patient to call with any questions or concerns.  [name] notes learning about the scope of her diagnosis and treatment has reduced her distress. She reports good support, including from her three daughters and their collective pets (cats, dog, and chickens).   Follow up needed: No. [name] describes herself as someone who processes inwardly and is aware of ongoing chaplain availability, should additional needs arise or circumstances change.   [place], [place], [facility] Pager [phone] Voicemail [phone]

## 2022-05-23 NOTE — Progress Notes (Signed)
[facility] NOTE  Patient Care Team: Jonathon Jordan, MD as PCP - General (Family Medicine) Nicholas Lose, MD as Consulting Physician (Hematology and Oncology) Stark Klein, MD as Consulting Physician (General Surgery) Kyung Rudd, MD as Consulting Physician (Radiation Oncology) Mauro Kaufmann, RN as Oncology Nurse Navigator Rockwell Germany, RN as Oncology Nurse Navigator  CHIEF COMPLAINTS/PURPOSE OF CONSULTATION:  Newly diagnosed breast cancer  HISTORY OF PRESENTING ILLNESS:  Andrea Johns 52 y.o. female is here because of recent diagnosis of right breast cancer.  Patient had routine screening mammogram that detected 2 abnormalities in the right breast.  Only one of them is biopsy-proven to be grade 1 IDC with DCIS that was ER/PR positive and HER2 negative.  Second area turned out to be benign.  She is here today accompanied by her husband to discuss treatment options  I reviewed her records extensively and collaborated the history with the patient.  SUMMARY OF ONCOLOGIC HISTORY: Oncology History  Malignant neoplasm of upper-outer quadrant of right breast in female, estrogen receptor positive ([place])  05/17/2022 Initial Diagnosis   Screening mammogram detected right breast asymmetry 12 o'clock position anteriorly measuring 0.7 cm, posteriorly there was enhancement (stereotactic biopsy: Benign) biopsy of the anterior abnormality revealed grade 1 IDC with low-grade DCIS and calcifications ER 80%, PR 100%, HER2 equivocal by IHC, FISH negative, Ki-67 5%      MEDICAL HISTORY:  Past Medical History:  Diagnosis Date   Anemia     SURGICAL HISTORY: Past Surgical History:  Procedure Laterality Date   BREAST BIOPSY Right 05/17/2022   [place] RT BREAST BX W LOC DEV 1ST LESION IMG BX SPEC US GUIDE 05/17/2022 [facility]   BREAST BIOPSY Right 05/17/2022   MM RT BREAST BX W LOC DEV 1ST LESION IMAGE BX SPEC STEREO GUIDE 05/17/2022 [facility]   EYE SURGERY      HAND SURGERY     TUBAL LIGATION     and reversal    SOCIAL HISTORY: Social History   Socioeconomic History   Marital status: Married    Spouse name: Not on file   Number of children: Not on file   Years of education: Not on file   Highest education level: Not on file  Occupational History   Not on file  Tobacco Use   Smoking status: Never   Smokeless tobacco: Never  Vaping Use   Vaping Use: Never used  Substance and Sexual Activity   Alcohol use: Yes    Alcohol/week: 1.0 standard drink of alcohol    Types: 1 Glasses of wine per week    Comment: 2-3 per week   Drug use: No   Sexual activity: Yes    Birth control/protection: None  Other Topics Concern   Not on file  Social History Narrative   Not on file   Social Determinants of Health   Financial Resource Strain: Not on file  Food Insecurity: Not on file  Transportation Needs: Not on file  Physical Activity: Not on file  Stress: Not on file  Social Connections: Not on file  Intimate Partner Violence: Not on file    FAMILY HISTORY: Family History  Problem Relation Age of Onset   Breast cancer Maternal Aunt    Anesthesia problems Neg Hx     ALLERGIES:  is allergic to ciprofloxacin and codeine.  MEDICATIONS:  Current Outpatient Medications  Medication Sig Dispense Refill   cephALEXin (KEFLEX) 500 MG capsule Take 1 capsule (500 mg total) by mouth  4 (four) times daily. 20 capsule 0   Diclofenac Sodium (PENNSAID) 2 % SOLN Place 1 application onto the skin 2 (two) times daily. 112 g 2   ibuprofen (ADVIL,MOTRIN) 200 MG tablet Take 200 mg by mouth every 6 (six) hours as needed.     traMADol-acetaminophen (ULTRACET) 37.5-325 MG tablet Take 1 tablet by mouth every 6 (six) hours as needed. 30 tablet 0   No current facility-administered medications for this visit.    REVIEW OF SYSTEMS:   Constitutional: Denies fevers, chills or abnormal night sweats  All other systems were reviewed with the patient and are  negative.  PHYSICAL EXAMINATION: ECOG PERFORMANCE STATUS: 1 - Symptomatic but completely ambulatory  Vitals:   05/23/22 1243  BP: (!) 155/79  Pulse: 80  Resp: 18  Temp: 97.7 F (36.5 C)  SpO2: 100%   Filed Weights   05/23/22 1243  Weight: 139 lb 9.6 oz (63.3 kg)    GENERAL:alert, no distress and comfortable  LABORATORY DATA:  I have reviewed the data as listed Lab Results  Component Value Date   WBC 7.6 05/23/2022   HGB 13.4 05/23/2022   HCT 40.4 05/23/2022   MCV 89.8 05/23/2022   PLT 456 (H) 05/23/2022   Lab Results  Component Value Date   NA 137 05/23/2022   K 4.0 05/23/2022   CL 103 05/23/2022   CO2 28 05/23/2022    RADIOGRAPHIC STUDIES: I have personally reviewed the radiological reports and agreed with the findings in the report.  ASSESSMENT AND PLAN:  Malignant neoplasm of upper-outer quadrant of right breast in female, estrogen receptor positive ([place]) 05/17/22: Screening mammogram detected right breast asymmetry 12 o'clock position anteriorly measuring 0.7 cm, posteriorly there was enhancement (stereotactic biopsy: Benign) biopsy of the anterior abnormality revealed grade 1 IDC with low-grade DCIS and calcifications ER 80%, PR 100%, HER2 equivocal by IHC, FISH negative, Ki-67 5%  Pathology and radiology counseling:Discussed with the patient, the details of pathology including the type of breast cancer,the clinical staging, the significance of ER, PR and HER-2/neu receptors and the implications for treatment. After reviewing the pathology in detail, we proceeded to discuss the different treatment options between surgery, radiation, chemotherapy, antiestrogen therapies.  Recommendations: 1. Breast conserving surgery followed by 2. Oncotype DX testing if the final tumor size is greater than 1 cm or high-grade 3. Adjuvant radiation therapy followed by 4. Adjuvant antiestrogen therapy  Oncotype counseling: I discussed Oncotype DX test. I explained to the patient  that this is a 21 gene panel to evaluate patient tumors DNA to calculate recurrence score. This would help determine whether patient has high risk or low risk breast cancer. She understands that if her tumor was found to be high risk, she would benefit from systemic chemotherapy. If low risk, no need of chemotherapy.  Return to clinic after surgery to discuss final pathology report and then determine if Oncotype DX testing will need to be sent.   All questions were answered. The patient knows to call the clinic with any problems, questions or concerns.    Harriette Ohara, MD 05/23/22

## 2022-05-23 NOTE — Therapy (Signed)
OUTPATIENT PHYSICAL THERAPY BREAST CANCER BASELINE EVALUATION   Patient Name: Andrea Johns MRN: [id number] DOB:1971/01/25, 52 y.o., female Today's Date: 05/23/2022  END OF SESSION:  PT End of Session - 05/23/22 1328     Visit Number 1    Number of Visits 2    Date for PT Re-Evaluation 07/18/22    PT Start Time 1331    PT Stop Time 1401    PT Time Calculation (min) 30 min    Activity Tolerance Patient tolerated treatment well    Behavior During Therapy [facility] for tasks assessed/performed             Past Medical History:  Diagnosis Date   Anemia    Past Surgical History:  Procedure Laterality Date   BREAST BIOPSY Right 05/17/2022   [place] RT BREAST BX W LOC DEV 1ST LESION IMG BX SPEC US GUIDE 05/17/2022 [facility]   BREAST BIOPSY Right 05/17/2022   MM RT BREAST BX W LOC DEV 1ST LESION IMAGE BX SPEC STEREO GUIDE 05/17/2022 [facility]   EYE SURGERY     HAND SURGERY     TUBAL LIGATION     and reversal   Patient Active Problem List   Diagnosis Date Noted   Malignant neoplasm of upper-outer quadrant of right breast in female, estrogen receptor positive ([place]) 05/21/2022   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   Chronic pain of both knees 11/06/2016    REFERRING PROVIDER: Dr. Erroll Luna   REFERRING DIAG: Right breast cancer  THERAPY DIAG:  Malignant neoplasm of upper-outer quadrant of right breast in female, estrogen receptor positive ([place])  Abnormal posture  Rationale for Evaluation and Treatment: Rehabilitation  ONSET DATE: 04/19/2022  SUBJECTIVE:                                                                                                                                                                                           SUBJECTIVE STATEMENT: Patient reports she is here today to be seen by her medical team for her newly diagnosed right breast cancer.   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 It measures 7 mm and is located in the upper outer quadrant. It is ER/PR positive and HER2 negative with a Ki67 of 5%.   PATIENT GOALS:   reduce lymphedema risk and learn post op HEP.   PAIN:  Are you having pain? No  PRECAUTIONS: Active CA   HAND DOMINANCE: right  WEIGHT BEARING RESTRICTIONS: No  FALLS:  Has patient fallen in last 6 months? No  LIVING ENVIRONMENT: Patient  lives with: her husband Lives in: House/apartment Has following equipment at home: None  OCCUPATION: unemployed  LEISURE: She does HITT 2-3x/week  PRIOR LEVEL OF FUNCTION: Independent   OBJECTIVE:  COGNITION: Overall cognitive status: Within functional limits for tasks assessed    POSTURE:  Forward head and rounded shoulders posture  UPPER EXTREMITY AROM/PROM:  A/PROM RIGHT   eval   Shoulder extension 58  Shoulder flexion 165  Shoulder abduction 172  Shoulder internal rotation 71  Shoulder external rotation 85    (Blank rows = not tested)  A/PROM LEFT   eval  Shoulder extension 53  Shoulder flexion 148  Shoulder abduction 170  Shoulder internal rotation 56  Shoulder external rotation 89    (Blank rows = not tested)  CERVICAL AROM: All within normal limits  UPPER EXTREMITY STRENGTH: WFL  LYMPHEDEMA ASSESSMENTS:   LANDMARK RIGHT   eval  10 cm proximal to olecranon process 28  Olecranon process 23.3  10 cm proximal to ulnar styloid process 20  Just proximal to ulnar styloid process 14.8  Across hand at thumb web space 17.5  At base of 2nd digit 5.7  (Blank rows = not tested)  LANDMARK LEFT   eval  10 cm proximal to olecranon process 27.7  Olecranon process 23.3  10 cm proximal to ulnar styloid process 19.8  Just proximal to ulnar styloid process 14.7  Across hand at thumb web space 17.3  At base of 2nd digit 5.6  (Blank rows = not tested)  L-DEX LYMPHEDEMA SCREENING:  The patient was assessed using the L-Dex machine  today to produce a lymphedema index baseline score. The patient will be reassessed on a regular basis (typically every 3 months) to obtain new L-Dex scores. If the score is > 6.5 points away from his/her baseline score indicating onset of subclinical lymphedema, it will be recommended to wear a compression garment for 4 weeks, 12 hours per day and then be reassessed. If the score continues to be > 6.5 points from baseline at reassessment, we will initiate lymphedema treatment. Assessing in this manner has a 95% rate of preventing clinically significant lymphedema.   L-DEX FLOWSHEETS - 05/23/22 1300       L-DEX LYMPHEDEMA SCREENING   Measurement Type Unilateral    L-DEX MEASUREMENT EXTREMITY Upper Extremity    POSITION  Standing    DOMINANT SIDE Right    At Risk Side Right    BASELINE SCORE (UNILATERAL) -0.9             PATIENT EDUCATION:  Education details: Lymphedema risk reduction and post op shoulder/posture HEP Person educated: Patient Education method: Explanation, Demonstration, Handout Education comprehension: Patient verbalized understanding and returned demonstration  HOME EXERCISE PROGRAM: Patient was instructed today in a home exercise program today for post op shoulder range of motion. These included active assist shoulder flexion in sitting, scapular retraction, wall walking with shoulder abduction, and hands behind head external rotation.  She was encouraged to do these twice a day, holding 3 seconds and repeating 5 times when permitted by her physician.   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 It measures 7 mm and is located in the upper outer quadrant. It is ER/PR positive and HER2 negative with a Ki67 of 5%. Her multidisciplinary medical team met prior to her assessments to determine a recommended treatment plan. She is planning to have a right lumpectomy and sentinel  node biopsy,  Oncotype if > 1 cm, radiation, and anti-estrogen therapy. She will benefit from a post op PT reassessment to determine needs and from L-Dex screens every 3 months for 2 years to detect subclinical lymphedema.  Pt will benefit from skilled therapeutic intervention to improve on the following deficits: Decreased knowledge of precautions, impaired UE functional use, pain, decreased ROM, postural dysfunction.   PT treatment/interventions: ADL/self-care home management, pt/family education, therapeutic exercise  REHAB POTENTIAL: Excellent  CLINICAL DECISION MAKING: Stable/uncomplicated  EVALUATION COMPLEXITY: Low   GOALS: Goals reviewed with patient? YES  LONG TERM GOALS: (STG=LTG)    Name Target Date Goal status  1 Pt will be able to verbalize understanding of pertinent lymphedema risk reduction practices relevant to her dx specifically related to skin care.  Baseline:  No knowledge 05/23/2022 Achieved at eval  2 Pt will be able to return demo and/or verbalize understanding of the post op HEP related to regaining shoulder ROM. Baseline:  No knowledge 05/23/2022 Achieved at eval  3 Pt will be able to verbalize understanding of the importance of attending the post op After Breast CA Class for further lymphedema risk reduction education and therapeutic exercise.  Baseline:  No knowledge 05/23/2022 Achieved at eval  4 Pt will demo she has regained full shoulder ROM and function post operatively compared to baselines.  Baseline: See objective measurements taken today. 07/18/2022     PLAN:  PT FREQUENCY/DURATION: EVAL and 1 follow up appointment.   PLAN FOR NEXT SESSION: will reassess 3-4 weeks post op to determine needs.   Patient will follow up at outpatient cancer rehab 3-4 weeks following surgery.  If the patient requires physical therapy at that time, a specific plan will be dictated and sent to the referring physician for approval. The patient was educated today on  appropriate basic range of motion exercises to begin post operatively and the importance of attending the After Breast Cancer class following surgery.  Patient was educated today on lymphedema risk reduction practices as it pertains to recommendations that will benefit the patient immediately following surgery.  She verbalized good understanding.    Physical Therapy Information for After Breast Cancer Surgery/Treatment:  Lymphedema is a swelling condition that you may be at risk for in your arm if you have lymph nodes removed from the armpit area.  After a sentinel node biopsy, the risk is approximately 5-9% and is higher after an axillary node dissection.  There is treatment available for this condition and it is not life-threatening.  Contact your physician or physical therapist with concerns. You may begin the 4 shoulder/posture exercises (see additional sheet) when permitted by your physician (typically a week after surgery).  If you have drains, you may need to wait until those are removed before beginning range of motion exercises.  A general recommendation is to not lift your arms above shoulder height until drains are removed.  These exercises should be done to your tolerance and gently.  This is not a "no pain/no gain" type of recovery so listen to your body and stretch into the range of motion that you can tolerate, stopping if you have pain.  If you are having immediate reconstruction, ask your plastic surgeon about doing exercises as he or she may want you to wait. We encourage you to attend the free one time ABC (After Breast Cancer) class offered by [facility].  You will learn information related to lymphedema risk, prevention and treatment and additional exercises to regain mobility following  surgery.  You can call [phone] for more information.  This is offered the 1st and 3rd Monday of each month.  You only attend the class one time. While undergoing any medical  procedure or treatment, try to avoid blood pressure being taken or needle sticks from occurring on the arm on the side of cancer.   This recommendation begins after surgery and continues for the rest of your life.  This may help reduce your risk of getting lymphedema (swelling in your arm). An excellent resource for those seeking information on lymphedema is the National Lymphedema Network's web site. It can be accessed at [place].org If you notice swelling in your hand, arm or breast at any time following surgery (even if it is many years from now), please contact your doctor or physical therapist to discuss this.  Lymphedema can be treated at any time but it is easier for you if it is treated early on.  If you feel like your shoulder motion is not returning to normal in a reasonable amount of time, please contact your surgeon or physical therapist.  [facility] [phone]. [address], [place] [address]  ABC CLASS After Breast Cancer Class  After Breast Cancer Class is a specially designed exercise class to assist you in a safe recover after having breast cancer surgery.  In this class you will learn how to get back to full function whether your drains were just removed or if you had surgery a month ago.  This one-time class is held the 1st and 3rd Monday of every month from 11:00 a.m. until 12:00 noon virtually.  This class is FREE and space is limited. For more information or to register for the next available class, call [phone].  Class Goals  Understand specific stretches to improve the flexibility of you chest and shoulder. Learn ways to safely strengthen your upper body and improve your posture. Understand the warning signs of infection and why you may be at risk for an arm infection. Learn about Lymphedema and prevention.  ** You do not attend this class until after surgery.  Drains must be removed to participate  Patient  was instructed today in a home exercise program today for post op shoulder range of motion. These included active assist shoulder flexion in sitting, scapular retraction, wall walking with shoulder abduction, and hands behind head external rotation.  She was encouraged to do these twice a day, holding 3 seconds and repeating 5 times when permitted by her physician.  Annia Friendly, [place] 05/23/22 3:38 PM

## 2022-05-23 NOTE — Assessment & Plan Note (Signed)
05/17/22: Screening mammogram detected right breast asymmetry 12 o'clock position anteriorly measuring 0.7 cm, posteriorly there was enhancement (stereotactic biopsy: Benign) biopsy of the anterior abnormality revealed grade 1 IDC with low-grade DCIS and calcifications ER 80%, PR 100%, HER2 equivocal by IHC, FISH negative, Ki-67 5%  Pathology and radiology counseling:Discussed with the patient, the details of pathology including the type of breast cancer,the clinical staging, the significance of ER, PR and HER-2/neu receptors and the implications for treatment. After reviewing the pathology in detail, we proceeded to discuss the different treatment options between surgery, radiation, chemotherapy, antiestrogen therapies.  Recommendations: 1. Breast conserving surgery followed by 2. Oncotype DX testing if the final tumor size is greater than 1 cm 3. Adjuvant radiation therapy followed by 4. Adjuvant antiestrogen therapy  Oncotype counseling: I discussed Oncotype DX test. I explained to the patient that this is a 21 gene panel to evaluate patient tumors DNA to calculate recurrence score. This would help determine whether patient has high risk or low risk breast cancer. She understands that if her tumor was found to be high risk, she would benefit from systemic chemotherapy. If low risk, no need of chemotherapy.  Return to clinic after surgery to discuss final pathology report and then determine if Oncotype DX testing will need to be sent.

## 2022-05-23 NOTE — Research (Signed)
[employer] 2021-05 - Specimen Collection Study to Evaluate Biomarkers in Subjects with Cancer    05/23/2022  CONSENT INTRO:  Patient Andrea Johns was identified by Dr. Lindi Adie as a potential candidate for the above listed study.  This Clinical Research Coordinator met with Andrea Johns, [id number], on 05/23/22 in a manner and location that ensures patient privacy to discuss participation in the above listed research study.  Patient is Accompanied by her husband .  A copy of the informed consent document with embedded HIPAA language was provided to the patient.  Patient reads, speaks, and understands [place].   Patient was provided with the business card of this Coordinator and encouraged to contact the research team with any questions.  Approximately 10 minutes were spent with the patient reviewing the informed consent documents.  Patient was provided the option of taking informed consent documents home to review and was encouraged to review at their convenience with their support network, including other care providers. Patient took the consent documents home to review.  Patient expressed interest in participation. Will follow-up with patient next week. Thanked patient for her time and consideration of the above mentioned study.   Carol Ada, RT(R)(T) Clinical Research Coordinator

## 2022-05-24 ENCOUNTER — Encounter: Payer: Self-pay | Admitting: Genetic Counselor

## 2022-05-24 NOTE — Progress Notes (Signed)
REFERRING PROVIDER: Nicholas Lose, MD  PRIMARY PROVIDER:  Jonathon Jordan, MD  PRIMARY REASON FOR VISIT:  Encounter Diagnoses  Name Primary?   Malignant neoplasm of upper-outer quadrant of right breast in female, estrogen receptor positive ([place]) Yes   Family history of breast cancer    HISTORY OF PRESENT ILLNESS:   Andrea Johns, a 52 y.o. female, was seen for a [place] cancer genetics consultation during the breast multidisciplinary clinic at the request of Dr. Lindi Adie due to a personal and family history of cancer.  Andrea Johns presents to clinic today to discuss the possibility of a hereditary predisposition to cancer, to discuss genetic testing, and to further clarify her future cancer risks, as well as potential cancer risks for family members.   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   CANCER HISTORY:  Oncology History  Malignant neoplasm of upper-outer quadrant of right breast in female, estrogen receptor positive ([place])  05/17/2022 Initial Diagnosis   Screening mammogram detected right breast asymmetry 12 o'clock position anteriorly measuring 0.7 cm, posteriorly there was enhancement (stereotactic biopsy: Benign) biopsy of the anterior abnormality revealed grade 1 IDC with low-grade DCIS and calcifications ER 80%, PR 100%, HER2 equivocal by IHC, FISH negative, Ki-67 5%   05/21/2022 Cancer Staging   Staging form: Breast, AJCC 8th Edition - Clinical stage from 05/21/2022: cT1b, cN0, cM0, G1 - Signed by Nicholas Lose, MD on 05/23/2022 Stage prefix: Initial diagnosis Method of lymph node assessment: Clinical Histologic grading system: 3 grade system      RISK FACTORS:  Menarche was at age 33.  First live birth at age 44.  OCP use for approximately 0 years.  Ovaries intact: yes.  Uterus intact: yes.  Menopausal status: premenopausal.  HRT use: 0 years. Colonoscopy: no Mammogram  within the last year: yes. Any excessive radiation exposure in the past: no  Past Medical History:  Diagnosis Date   Anemia     Past Surgical History:  Procedure Laterality Date   BREAST BIOPSY Right 05/17/2022   [place] RT BREAST BX W LOC DEV 1ST LESION IMG BX SPEC US GUIDE 05/17/2022 [facility]   BREAST BIOPSY Right 05/17/2022   MM RT BREAST BX W LOC DEV 1ST LESION IMAGE BX SPEC STEREO GUIDE 05/17/2022 [facility]   EYE SURGERY     HAND SURGERY     TUBAL LIGATION     and reversal    Social History   Socioeconomic History   Marital status: Married    Spouse name: Not on file   Number of children: Not on file   Years of education: Not on file   Highest education level: Not on file  Occupational History   Not on file  Tobacco Use   Smoking status: Never   Smokeless tobacco: Never  Vaping Use   Vaping Use: Never used  Substance and Sexual Activity   Alcohol use: Yes    Alcohol/week: 1.0 standard drink of alcohol    Types: 1 Glasses of wine per week    Comment: 2-3 per week   Drug use: No   Sexual activity: Yes    Birth control/protection: None  Other Topics Concern   Not on file  Social History Narrative   Not on file   Social Determinants of Health   Financial Resource Strain: Not on file  Food Insecurity: Not on file  Transportation Needs: Not on  file  Physical Activity: Not on file  Stress: Not on file  Social Connections: Not on file     FAMILY HISTORY:  We obtained a detailed, 4-generation family history.  Significant diagnoses are listed below: Family History  Problem Relation Age of Onset   Breast cancer Maternal Aunt 68   Lung cancer Paternal Uncle 60       smoked   Anesthesia problems Neg Hx        Andrea Johns's maternal aunt was diagnosed with breast cancer at age 70. Her paternal uncle was diagnosed with lung cancer at age 39, he smoked and is deceased. Andrea Johns is unaware of previous family history of genetic testing for hereditary  cancer risks. There is no reported Ashkenazi Jewish ancestry.   GENETIC COUNSELING ASSESSMENT: Andrea Johns is a 52 y.o. female with a personal and family history of cancer which is somewhat suggestive of a hereditary cancer syndrome and predisposition to cancer given her young age at diagnosis. We, therefore, discussed and recommended the following at today's visit.   DISCUSSION: We discussed that 5 - 10% of cancer is hereditary, with most cases of hereditary breast cancer associated with mutations in BRCA1/2.  There are other genes that can be associated with hereditary breast cancer syndromes. Type of cancer risk and level of risk are gene-specific. We discussed that testing is beneficial for several reasons including knowing how to follow individuals after completing their treatment, identifying whether potential treatment options would be beneficial, and understanding if other family members could be at risk for cancer and allowing them to undergo genetic testing.   We reviewed the characteristics, features and inheritance patterns of hereditary cancer syndromes. We also discussed genetic testing, including the appropriate family members to test, the process of testing, insurance coverage and turn-around-time for results. We discussed the implications of a negative, positive and/or variant of uncertain significant result. In order to get genetic test results in a timely manner so that Andrea Johns can use these genetic test results for surgical decisions, we recommended Andrea Johns pursue genetic testing for the [facility] Breast Cancer STAT Panel. Once complete, we recommend Andrea Johns pursue reflex genetic testing to a more comprehensive gene panel.   Andrea Johns elected to have [facility] Common Cancer Panel. The Common Hereditary Cancers Panel offered by [facility] includes sequencing and/or deletion duplication testing of the following 48 genes: APC, ATM, AXIN2, BAP1, BARD1, BMPR1A, BRCA1, BRCA2, BRIP1, CDH1, CDK4,  CDKN2A (p14ARF and p16INK4a only), CHEK2, CTNNA1, DICER1, EPCAM (Deletion/duplication testing only), FH, GREM1 (promoter region duplication testing only), HOXB13, KIT, MBD4, MEN1, MLH1, MSH2, MSH3, MSH6, MUTYH, NF1, NHTL1, PALB2, PDGFRA, PMS2, POLD1, POLE, PTEN, RAD51C, RAD51D, SDHA (sequencing analysis only except exon 14), SDHB, SDHC, SDHD, SMAD4, SMARCA4. STK11, TP53, TSC1, TSC2, and VHL.  Based on Andrea Johns's personal and family history of cancer, she does not meet NCCN criteria for genetic testing. However, we are currently recommending genetic testing to all women diagnosed with breast cancer under age 58. She may have an out of pocket cost. We discussed that if her out of pocket cost for testing is over $100, the laboratory should contact them to discuss self-pay prices, patient pay assistance programs, if applicable, and other billing options.   PLAN: After considering the risks, benefits, and limitations, Andrea Johns provided informed consent to pursue genetic testing and the blood sample was sent to [facility] for analysis of the Common Cancer Panel. Results should be available within approximately 1-2 weeks' time, at  which point they will be disclosed by telephone to Andrea Johns, as will any additional recommendations warranted by these results. Andrea Johns will receive a summary of her genetic counseling visit and a copy of her results once available. This information will also be available in Epic.   Andrea Johns questions were answered to her satisfaction today. Our contact information was provided should additional questions or concerns arise. Thank you for the referral and allowing [place] to share in the care of your patient.   Andrea Passy, MS, [facility] Genetic Counselor [email or web address] (P) [phone]  The patient was seen for a total of 20 minutes in face-to-face genetic counseling.  The patient brought her husband. Drs. Lindi Adie and/or Burr Medico were available to discuss this  case as needed.  _______________________________________________________________________ For Office Staff:  Number of people involved in session: 2 Was an Intern/ student involved with case: no

## 2022-05-25 ENCOUNTER — Telehealth: Payer: Self-pay | Admitting: Hematology and Oncology

## 2022-05-25 ENCOUNTER — Other Ambulatory Visit: Payer: Self-pay | Admitting: Surgery

## 2022-05-25 DIAGNOSIS — C50911 Malignant neoplasm of unspecified site of right female breast: Secondary | ICD-10-CM

## 2022-05-25 NOTE — Telephone Encounter (Signed)
Per 3/22 IB reached out to patient to schedule; patient aware of date and time of appointment.

## 2022-05-28 ENCOUNTER — Telehealth: Payer: Self-pay

## 2022-05-28 DIAGNOSIS — Z170 Estrogen receptor positive status [ER+]: Secondary | ICD-10-CM

## 2022-05-28 NOTE — Telephone Encounter (Signed)
Exact Sciences 2021-05 - Specimen Collection Study to Evaluate Biomarkers in Subjects with Cancer   Called the patient to see if they wanted to participate in the Exact study. The patient agreed to participation and will be completing study procedures on 11APR2024.   Andrea Johns, [facility] 05/28/2022 4:47 PM

## 2022-05-30 ENCOUNTER — Encounter: Payer: Self-pay | Admitting: *Deleted

## 2022-05-30 ENCOUNTER — Telehealth: Payer: Self-pay | Admitting: *Deleted

## 2022-05-30 NOTE — Telephone Encounter (Signed)
Spoke with patient to follow up call from [facility] 3/20 and assess navigation needs. Patient denies any questions or concerns.  Encouraged her to call should anything arise. Patient verbalized understanding.

## 2022-05-31 ENCOUNTER — Telehealth: Payer: Self-pay | Admitting: Genetic Counselor

## 2022-05-31 ENCOUNTER — Encounter: Payer: Self-pay | Admitting: Genetic Counselor

## 2022-05-31 DIAGNOSIS — Z1379 Encounter for other screening for genetic and chromosomal anomalies: Secondary | ICD-10-CM | POA: Insufficient documentation

## 2022-05-31 NOTE — Telephone Encounter (Signed)
I contacted Ms. Helfert to discuss her genetic testing results. No pathogenic variants were identified in the 48 genes analyzed. Detailed clinic note to follow.  The test report has been scanned into EPIC and is located under the Molecular Pathology section of the Results Review tab.  A portion of the result report is included below for reference.   Lucille Passy, MS, [facility] Genetic Counselor [email or web address] (P) [phone]

## 2022-06-01 ENCOUNTER — Encounter: Payer: Self-pay | Admitting: Genetic Counselor

## 2022-06-01 ENCOUNTER — Ambulatory Visit: Payer: Self-pay | Admitting: Genetic Counselor

## 2022-06-01 DIAGNOSIS — Z1379 Encounter for other screening for genetic and chromosomal anomalies: Secondary | ICD-10-CM

## 2022-06-01 NOTE — Progress Notes (Signed)
HPI:   [name] was previously seen in the [place] clinic due to a personal and family history of cancer and concerns regarding a hereditary predisposition to cancer. Please refer to our prior cancer genetics clinic note for more information regarding our discussion, assessment and recommendations, at the time. [name] recent genetic test results were disclosed to her, as were recommendations warranted by these results. These results and recommendations are discussed in more detail below.  CANCER HISTORY:  Oncology History  Malignant neoplasm of upper-outer quadrant of right breast in female, estrogen receptor positive ([place])  05/17/2022 Initial Diagnosis   Screening mammogram detected right breast asymmetry 12 o'clock position anteriorly measuring 0.7 cm, posteriorly there was enhancement (stereotactic biopsy: Benign) biopsy of the anterior abnormality revealed grade 1 IDC with low-grade DCIS and calcifications ER 80%, PR 100%, HER2 equivocal by IHC, FISH negative, Ki-67 5%   05/21/2022 Cancer Staging   Staging form: Breast, AJCC 8th Edition - Clinical stage from 05/21/2022: cT1b, cN0, cM0, G1 - Signed by Nicholas Lose, MD on 05/23/2022 Stage prefix: Initial diagnosis Method of lymph node assessment: Clinical Histologic grading system: 3 grade system    Genetic Testing   [facility] Common Cancer Panel+RNA was Negative. Report date is 05/29/2022.  The Common Hereditary Cancers Panel offered by [facility] includes sequencing and/or deletion duplication testing of the following 48 genes: APC, ATM, AXIN2, BAP1, BARD1, BMPR1A, BRCA1, BRCA2, BRIP1, CDH1, CDK4, CDKN2A (p14ARF and p16INK4a only), CHEK2, CTNNA1, DICER1, EPCAM (Deletion/duplication testing only), FH, GREM1 (promoter region duplication testing only), HOXB13, KIT, MBD4, MEN1, MLH1, MSH2, MSH3, MSH6, MUTYH, NF1, NHTL1, PALB2, PDGFRA, PMS2, POLD1, POLE, PTEN, RAD51C, RAD51D, SDHA (sequencing analysis only except exon 14), SDHB,  SDHC, SDHD, SMAD4, SMARCA4. STK11, TP53, TSC1, TSC2, and VHL.     FAMILY HISTORY:  We obtained a detailed, 4-generation family history.  Significant diagnoses are listed below:      Family History  Problem Relation Age of Onset   Breast cancer Maternal Aunt 68   Lung cancer Paternal Uncle 60        smoked   Anesthesia problems Neg Hx             [name] maternal aunt was diagnosed with breast cancer at age 13. Her paternal uncle was diagnosed with lung cancer at age 55, he smoked and is deceased. [name] is unaware of previous family history of genetic testing for hereditary cancer risks. There is no reported Ashkenazi Jewish ancestry.   GENETIC TEST RESULTS:  The [facility] Common Cancer Panel found no pathogenic mutations.   The Common Hereditary Cancers Panel offered by [facility] includes sequencing and/or deletion duplication testing of the following 48 genes: APC, ATM, AXIN2, BAP1, BARD1, BMPR1A, BRCA1, BRCA2, BRIP1, CDH1, CDK4, CDKN2A (p14ARF and p16INK4a only), CHEK2, CTNNA1, DICER1, EPCAM (Deletion/duplication testing only), FH, GREM1 (promoter region duplication testing only), HOXB13, KIT, MBD4, MEN1, MLH1, MSH2, MSH3, MSH6, MUTYH, NF1, NHTL1, PALB2, PDGFRA, PMS2, POLD1, POLE, PTEN, RAD51C, RAD51D, SDHA (sequencing analysis only except exon 14), SDHB, SDHC, SDHD, SMAD4, SMARCA4. STK11, TP53, TSC1, TSC2, and VHL.  The test report has been scanned into EPIC and is located under the Molecular Pathology section of the Results Review tab.  A portion of the result report is included below for reference. Genetic testing reported out on 05/29/2022.      Even though a pathogenic variant was not identified, possible explanations for the cancer in the family may include: There may be no hereditary risk for  cancer in the family. The cancers in [name] and/or her family may be due to other genetic or environmental factors. There may be a gene mutation in one of these genes that current  testing methods cannot detect, but that chance is small. There could be another gene that has not yet been discovered, or that we have not yet tested, that is responsible for the cancer diagnoses in the family.   Therefore, it is important to remain in touch with cancer genetics in the future so that we can continue to offer [name] the most up to date genetic testing.   ADDITIONAL GENETIC TESTING:  We discussed with [name] that her genetic testing was fairly extensive.  If there are genes identified to increase cancer risk that can be analyzed in the future, we would be happy to discuss and coordinate this testing at that time.    CANCER SCREENING RECOMMENDATIONS:  [name] test result is considered negative (normal).  This means that we have not identified a hereditary cause for her personal and family history of cancer at this time.  An individual's cancer risk and medical management are not determined by genetic test results alone. Overall cancer risk assessment incorporates additional factors, including personal medical history, family history, and any available genetic information that may result in a personalized plan for cancer prevention and surveillance. Therefore, it is recommended she continue to follow the cancer management and screening guidelines provided by her oncology and primary healthcare provider.  RECOMMENDATIONS FOR FAMILY MEMBERS:   Since she did not inherit a mutation in a cancer predisposition gene included on this panel, her children could not have inherited a mutation from her in one of these genes. Individuals in this family might be at some increased risk of developing cancer, over the general population risk, due to the family history of cancer. We recommend women in this family have a yearly mammogram beginning at age 28, or 2 years younger than the earliest onset of cancer, an annual clinical breast exam, and perform monthly breast self-exams.  FOLLOW-UP:   Cancer genetics is a rapidly advancing field and it is possible that new genetic tests will be appropriate for her and/or her family members in the future. We encouraged her to remain in contact with cancer genetics on an annual basis so we can update her personal and family histories and let her know of advances in cancer genetics that may benefit this family.   Our contact number was provided. [name] questions were answered to her satisfaction, and she knows she is welcome to call us at anytime with additional questions or concerns.   Lucille Passy, MS, [facility] Genetic Counselor [email or web address] (P) [phone]

## 2022-06-14 ENCOUNTER — Inpatient Hospital Stay: Payer: 59

## 2022-06-14 ENCOUNTER — Telehealth: Payer: Self-pay

## 2022-06-14 ENCOUNTER — Inpatient Hospital Stay: Payer: 59 | Admitting: Radiology

## 2022-06-14 DIAGNOSIS — C50411 Malignant neoplasm of upper-outer quadrant of right female breast: Secondary | ICD-10-CM

## 2022-06-14 NOTE — Telephone Encounter (Signed)
Exact Sciences 2021-05 - Specimen Collection Study to Evaluate Biomarkers in Subjects with Cancer   Called the patient about them coming in for their consent and lab visit for the Exact study. The patient that they were sick and couldn't come in for the study today. The patient has been rescheduled for Thursday 19APR2024.   Andrea Johns, [facility] 06/14/2022 11:08 AM

## 2022-06-18 ENCOUNTER — Telehealth: Payer: Self-pay | Admitting: *Deleted

## 2022-06-18 NOTE — Telephone Encounter (Signed)
Received call from pt stating she was seen in the [facility] ED in [place] for right abdominal pain.  Pt states a CT scan showed a 6 cm right ovarian cyst.  Pt requesting advice from MD for further evaluation.  Per MD pt needing to f/u with gynecologist.  Pt states she is currently established with Dr. Marcelle Overlie with [facility].  RN placed call and left message with Dr. Dennie Bible nurse regarding recommendations for GYN to f/u on 6 cm mass.

## 2022-06-21 ENCOUNTER — Inpatient Hospital Stay: Payer: 59 | Attending: Hematology and Oncology | Admitting: Radiology

## 2022-06-21 ENCOUNTER — Other Ambulatory Visit: Payer: Self-pay | Admitting: Obstetrics and Gynecology

## 2022-06-21 ENCOUNTER — Inpatient Hospital Stay: Payer: 59

## 2022-06-21 ENCOUNTER — Encounter (HOSPITAL_BASED_OUTPATIENT_CLINIC_OR_DEPARTMENT_OTHER): Payer: Self-pay | Admitting: Surgery

## 2022-06-21 ENCOUNTER — Telehealth: Payer: Self-pay

## 2022-06-21 DIAGNOSIS — Z170 Estrogen receptor positive status [ER+]: Secondary | ICD-10-CM

## 2022-06-21 DIAGNOSIS — N83209 Unspecified ovarian cyst, unspecified side: Secondary | ICD-10-CM

## 2022-06-21 NOTE — Telephone Encounter (Signed)
Ex[employer] Sciences 2021-05 - Specimen Collection Study to Evaluate Biomarkers in Subjects with Cancer   Called the patient to see if they would still be coming for their research appointment today. The patient did not answer and I left a message with my contact information if they are still available.  Felecia Jan, [facility] 06/21/2022 10:41 AM

## 2022-06-22 ENCOUNTER — Other Ambulatory Visit: Payer: Self-pay

## 2022-06-22 ENCOUNTER — Inpatient Hospital Stay (HOSPITAL_COMMUNITY)
Admission: RE | Admit: 2022-06-22 | Discharge: 2022-06-26 | DRG: 759 | Disposition: A | Discharge status: Home / Self Care | Payer: 59 | Attending: Obstetrics & Gynecology | Admitting: Obstetrics & Gynecology

## 2022-06-22 ENCOUNTER — Ambulatory Visit
Admission: RE | Admit: 2022-06-22 | Discharge: 2022-06-22 | Disposition: A | Discharge status: Home / Self Care | Payer: 59 | Source: Ambulatory Visit | Attending: Obstetrics and Gynecology | Admitting: Obstetrics and Gynecology

## 2022-06-22 ENCOUNTER — Encounter (HOSPITAL_COMMUNITY): Payer: Self-pay | Admitting: Obstetrics & Gynecology

## 2022-06-22 DIAGNOSIS — N739 Female pelvic inflammatory disease, unspecified: Principal | ICD-10-CM | POA: Diagnosis present

## 2022-06-22 DIAGNOSIS — N83209 Unspecified ovarian cyst, unspecified side: Secondary | ICD-10-CM

## 2022-06-22 DIAGNOSIS — R7401 Elevation of levels of liver transaminase levels: Secondary | ICD-10-CM | POA: Diagnosis present

## 2022-06-22 DIAGNOSIS — Z01818 Encounter for other preprocedural examination: Secondary | ICD-10-CM

## 2022-06-22 DIAGNOSIS — R102 Pelvic and perineal pain: Secondary | ICD-10-CM | POA: Diagnosis present

## 2022-06-22 DIAGNOSIS — K5900 Constipation, unspecified: Secondary | ICD-10-CM | POA: Diagnosis present

## 2022-06-22 DIAGNOSIS — D72829 Elevated white blood cell count, unspecified: Secondary | ICD-10-CM | POA: Diagnosis present

## 2022-06-22 DIAGNOSIS — N938 Other specified abnormal uterine and vaginal bleeding: Secondary | ICD-10-CM | POA: Diagnosis present

## 2022-06-22 DIAGNOSIS — Z801 Family history of malignant neoplasm of trachea, bronchus and lung: Secondary | ICD-10-CM

## 2022-06-22 DIAGNOSIS — Z803 Family history of malignant neoplasm of breast: Secondary | ICD-10-CM

## 2022-06-22 DIAGNOSIS — C50919 Malignant neoplasm of unspecified site of unspecified female breast: Secondary | ICD-10-CM | POA: Diagnosis present

## 2022-06-22 LAB — COMPREHENSIVE METABOLIC PANEL
ALT: 186 U/L — ABNORMAL HIGH (ref 0–44)
AST: 101 U/L — ABNORMAL HIGH (ref 15–41)
Albumin: 2.7 g/dL — ABNORMAL LOW (ref 3.5–5.0)
Alkaline Phosphatase: 413 U/L — ABNORMAL HIGH (ref 38–126)
Anion gap: 11 (ref 5–15)
BUN: <5 mg/dL — ABNORMAL LOW (ref 6–20)
CO2: 24 mmol/L (ref 22–32)
Calcium: 9.1 mg/dL (ref 8.9–10.3)
Chloride: 99 mmol/L (ref 98–111)
Creatinine, Ser: 0.81 mg/dL (ref 0.44–1.00)
GFR, Estimated: >60 mL/min (ref >60)
Glucose, Bld: 101 mg/dL — ABNORMAL HIGH (ref 70–99)
Potassium: 4 mmol/L (ref 3.5–5.1)
Sodium: 134 mmol/L — ABNORMAL LOW (ref 135–145)
Total Bilirubin: 0.7 mg/dL (ref 0.3–1.2)
Total Protein: 6.9 g/dL (ref 6.5–8.1)

## 2022-06-22 LAB — CBC WITH DIFFERENTIAL/PLATELET
Abs Immature Granulocytes: 0.1 10*3/uL — ABNORMAL HIGH (ref 0.00–0.07)
Basophils Absolute: 0 10*3/uL (ref 0.0–0.1)
Basophils Relative: 0 %
Eosinophils Absolute: 0 10*3/uL (ref 0.0–0.5)
Eosinophils Relative: 0 %
HCT: 35 % — ABNORMAL LOW (ref 36.0–46.0)
Hemoglobin: 11.5 g/dL — ABNORMAL LOW (ref 12.0–15.0)
Immature Granulocytes: 1 %
Lymphocytes Relative: 7 %
Lymphs Abs: 1.2 10*3/uL (ref 0.7–4.0)
MCH: 29.4 pg (ref 26.0–34.0)
MCHC: 32.9 g/dL (ref 30.0–36.0)
MCV: 89.5 fL (ref 80.0–100.0)
Monocytes Absolute: 1.7 10*3/uL — ABNORMAL HIGH (ref 0.1–1.0)
Monocytes Relative: 10 %
Neutro Abs: 13.9 10*3/uL — ABNORMAL HIGH (ref 1.7–7.7)
Neutrophils Relative %: 82 %
Platelets: 665 10*3/uL — ABNORMAL HIGH (ref 150–400)
RBC: 3.91 MIL/uL (ref 3.87–5.11)
RDW: 13.2 % (ref 11.5–15.5)
WBC: 17 10*3/uL — ABNORMAL HIGH (ref 4.0–10.5)
nRBC: 0 % (ref 0.0–0.2)

## 2022-06-22 MED ORDER — LACTATED RINGERS IV SOLN: INTRAVENOUS | Status: DC

## 2022-06-22 MED ORDER — LACTATED RINGERS IV SOLN
INTRAVENOUS | Status: DC
Start: 2022-06-22 — End: 2022-06-22

## 2022-06-22 MED ORDER — IBUPROFEN 600 MG PO TABS
600.0000 mg | ORAL_TABLET | Freq: Four times a day (QID) | ORAL | Status: DC | PRN
  Administered 2022-06-23 – 2022-06-26 (×10): 600 mg via ORAL
  Filled 2022-06-22 (×10): qty 1

## 2022-06-22 MED ORDER — ONDANSETRON HCL 4 MG/2ML IJ SOLN
4.0000 mg | Freq: Four times a day (QID) | INTRAMUSCULAR | Status: DC | PRN
  Administered 2022-06-22 – 2022-06-24 (×4): 4 mg via INTRAVENOUS
  Filled 2022-06-22 (×4): qty 2

## 2022-06-22 MED ORDER — ZOLPIDEM TARTRATE 5 MG PO TABS: 5.0000 mg | ORAL_TABLET | Freq: Every evening | ORAL | Status: DC | PRN

## 2022-06-22 MED ORDER — SODIUM CHLORIDE 0.9 % IV SOLN
2.0000 g | Freq: Two times a day (BID) | INTRAVENOUS | Status: DC
  Administered 2022-06-22 – 2022-06-26 (×8): 2 g via INTRAVENOUS
  Filled 2022-06-22 (×8): qty 2

## 2022-06-22 MED ORDER — DOXYCYCLINE HYCLATE 100 MG PO TABS
100.0000 mg | ORAL_TABLET | Freq: Two times a day (BID) | ORAL | Status: DC
  Administered 2022-06-22 – 2022-06-26 (×8): 100 mg via ORAL
  Filled 2022-06-22 (×8): qty 1

## 2022-06-22 MED ORDER — CEFAZOLIN SODIUM-DEXTROSE 2-4 GM/100ML-% IV SOLN
2.0000 g | INTRAVENOUS | Status: DC
Start: 2022-06-23 — End: 2022-06-22

## 2022-06-22 MED ORDER — PRENATAL MULTIVITAMIN CH: 1.0000 | ORAL_TABLET | Freq: Every day | ORAL | Status: DC

## 2022-06-22 MED ORDER — ACETAMINOPHEN 500 MG PO TABS
1000.0000 mg | ORAL_TABLET | ORAL | Status: DC
Start: 2022-06-23 — End: 2022-06-22

## 2022-06-22 MED ORDER — ACETAMINOPHEN 325 MG PO TABS
650.0000 mg | ORAL_TABLET | Freq: Four times a day (QID) | ORAL | Status: DC | PRN
  Administered 2022-06-22 – 2022-06-23 (×3): 650 mg via ORAL
  Filled 2022-06-22 (×3): qty 2

## 2022-06-22 MED ORDER — GADOPICLENOL 0.5 MMOL/ML IV SOLN
6.0000 mL | Freq: Once | INTRAVENOUS | Status: AC | PRN
  Administered 2022-06-22: 6 mL via INTRAVENOUS

## 2022-06-22 MED ORDER — ONDANSETRON HCL 4 MG PO TABS
4.0000 mg | ORAL_TABLET | Freq: Four times a day (QID) | ORAL | Status: DC | PRN
  Administered 2022-06-24: 4 mg via ORAL
  Filled 2022-06-22: qty 1

## 2022-06-22 MED ORDER — CHLORHEXIDINE GLUCONATE CLOTH 2 % EX PADS
6.0000 | MEDICATED_PAD | Freq: Once | CUTANEOUS | Status: DC
Start: 2022-06-22 — End: 2022-06-22

## 2022-06-22 NOTE — Plan of Care (Signed)

## 2022-06-22 NOTE — H&P (Signed)
Andrea Johns is an 52 y.o. female [phone] with pelvic pain and bilateral adnexal masses.    Patient has early stage breast cancer, is actually scheduled for lumpectomy next Thursday.  She had recent [facility] on 4/8 for IMB by primary, Dr. Marcelle Overlie, that showed a 3.6cm simple left ovarian cyst, right side was normal and her cavity looked pretty clean, she is perimenopausal by [facility].  Over the weekend was seen in the ED at [facility] where a CT showed bilateral, more complex-appearing ovarian cyst, 6 x 6 on the right, no free fluid and 4 cm or so on the left. They had actually recommended pelvic MRI and wanted follow up with GYN.  She was given narcotics for pain management from the ED but could not really tolerate that due to nausea.   Also in the interim, her CA-125 was drawn that was slightly elevated at 51.9.  CBC in the office yesterday with WBC 18.5 with left shift.  MRI today showed bilateral adnexal serpiginous cystic areas. Favor these being paraovarian and dilated fallopian tubes. The left appears more simple. The right has nodular enhancement and thickened septa as well as a thickened wall. Right mass measures 5.7 x 5.1 x 5.5cm and left measures 4.3 x 3.1 x 5.2 cm  Patient reports feeling febrile last night but did not have thermometer.  Her pain has been well managed with Tylenol/Ibuprofen.  She denies N/V.  Reports decreased appetite.    Pertinent Gynecological History: Menses: flow is moderate Bleeding: dysfunctional uterine bleeding Contraception: vasectomy DES exposure: unknown Blood transfusions: none Sexually transmitted diseases: no past history Previous GYN Procedures:  BTL with reversal   Last mammogram: abnormal: breast cancer  Date: 04/19/22 Last pap: normal Date: 04/19/22 OB History: G5, P3013   Menstrual History: Menarche age: n/a Patient's last menstrual period was 06/11/2022 (approximate).    Past Medical History:  Diagnosis Date   Anemia     Past Surgical History:   Procedure Laterality Date   BREAST BIOPSY Right 05/17/2022   [place] RT BREAST BX W LOC DEV 1ST LESION IMG BX SPEC US GUIDE 05/17/2022 [facility]   BREAST BIOPSY Right 05/17/2022   MM RT BREAST BX W LOC DEV 1ST LESION IMAGE BX SPEC STEREO GUIDE 05/17/2022 [facility]   EYE SURGERY     HAND SURGERY     TUBAL LIGATION     and reversal    Family History  Problem Relation Age of Onset   Breast cancer Maternal Aunt 105   Lung cancer Paternal Uncle 60       smoked   Anesthesia problems Neg Hx     Social History:  reports that she has never smoked. She has never used smokeless tobacco. She reports current alcohol use of about 1.0 standard drink of alcohol per week. She reports that she does not use drugs.  Allergies:  Allergies  Allergen Reactions   Ciprofloxacin Nausea And Vomiting   Codeine Nausea And Vomiting   Hydrocodone-Acetaminophen Nausea And Vomiting    Medications Prior to Admission  Medication Sig Dispense Refill Last Dose   ibuprofen (ADVIL,MOTRIN) 200 MG tablet Take 200 mg by mouth every 6 (six) hours as needed.      sulfamethoxazole-trimethoprim (BACTRIM DS) 800-160 MG tablet Take 1 tablet by mouth 2 (two) times daily.      traMADol-acetaminophen (ULTRACET) 37.5-325 MG tablet Take 1 tablet by mouth every 6 (six) hours as needed. 30 tablet 0     Review of Systems  Blood  pressure (!) 156/75, pulse 93, temperature 98 F (36.7 C), temperature source Oral, resp. rate 20, last menstrual period 06/11/2022. Physical Exam Constitutional:      Appearance: Normal appearance.  HENT:     Head: Normocephalic and atraumatic.  Pulmonary:     Effort: Pulmonary effort is normal.  Abdominal:     Palpations: Abdomen is soft.     Tenderness: There is abdominal tenderness. There is no rebound.  Musculoskeletal:        General: Normal range of motion.     Cervical back: Normal range of motion.  Skin:    General: Skin is warm and dry.  Neurological:     Mental Status:  She is alert and oriented to person, place, and time.  Psychiatric:        Mood and Affect: Mood normal.        Behavior: Behavior normal.     No results found for this or any previous visit (from the past 24 hour(s)).  MR PELVIS W WO CONTRAST  Result Date: 06/22/2022 CLINICAL DATA:  Breast cancer.  Pelvic lesion. EXAM: MRI PELVIS WITHOUT AND WITH CONTRAST TECHNIQUE: Multiplanar multisequence MR imaging of the pelvis was performed both before and after administration of intravenous contrast. CONTRAST:  6cc from a single use bottle, 1.5 cc discarded. vueway COMPARISON:  None Available. FINDINGS: Urinary Tract: Preserved contours of the urinary bladder. Preserved course of the urethra. Bowel: Visualized bowel in the pelvis is nondilated. Normal appendix in the right lower quadrant. Vascular/Lymphatic: Preserved iliac vessels. No specific abnormal lymph node enlargement in the pelvis. Reproductive: Uterus: Measures uterus measures 9.3 x 5.0 x 6.8 cm. Nabothian cysts are seen. Endometrial stripe measures 10 mm. Please correlate with the patient's menstrual cycle. The endometrium has a slightly cystic appearance with some heterogeneous enhancement. No enhancing myometrial mass. Prominent periuterine vessels. Right ovary: There is a complex lesion involving the right adnexa which appears to involve the margin of the ovary. On sagittal images this appears somewhat serpiginous could be an abnormal fallopian tube versus a true paraovarian cystic mass. There is marginal enhancement and nodularity. This is also mildly bright on precontrast T1 and heterogeneous on T2. Overall measuring 5.7 by 5.1 5.5 cm. Normal ovary with follicles seen along the posterior aspect of the structure measuring 2.4 by 1.6 by 2.3 cm. Left ovary: There is also a cystic structure along the margin of the left ovary also has a serpiginous appearance and may represent a dilated fallopian tube. This area measures 4.3 3.1 by 5.2 cm. The  left-sided structure has thin septations. No nodular enhancement. The left ovary measures 2.5 x 1.1 by 2.1 cm. Other: Trace pelvic free fluid. Musculoskeletal: Mild degenerative changes of the spine. There is some disc bulging and annular tear at the L4-5 level. Please correlate with lumbar spine MRI 03/07/2022 IMPRESSION: Bilateral adnexal serpiginous cystic areas. Favor these being paraovarian and dilated fallopian tubes. The left appears more simple. The right has nodular enhancement and thickened septa as well as a thickened wall. An aggressive process is possible. Please correlate for any clinical signs of infection or neoplasm. Recommend close follow-up or further evaluation. Heterogeneous appearance of the endometrium with some cystic like areas. Please correlate with patient's menstrual cycle. Follow up ultrasound in 6-12 weeks. Trace free fluid in the pelvis. Electronically Signed   By: Karen Kays M.D.   On: 06/22/2022 16:25    Assessment/Plan: 52yo with PID -Cefotetan/Doxy-continue x 24-48 hours of clinical improvement.  Plan to d/c  home with po abx (doxy/metro) for total of 14 days -Tylenol/Motrin prn pain -Plan outpatient repeat imaging -SCDs  Mitchel Honour 06/22/2022, 8:33 PM

## 2022-06-23 ENCOUNTER — Encounter (HOSPITAL_COMMUNITY): Payer: Self-pay | Admitting: Obstetrics & Gynecology

## 2022-06-23 DIAGNOSIS — R102 Pelvic and perineal pain: Secondary | ICD-10-CM | POA: Diagnosis present

## 2022-06-23 DIAGNOSIS — N739 Female pelvic inflammatory disease, unspecified: Secondary | ICD-10-CM | POA: Diagnosis present

## 2022-06-23 DIAGNOSIS — Z801 Family history of malignant neoplasm of trachea, bronchus and lung: Secondary | ICD-10-CM | POA: Diagnosis not present

## 2022-06-23 DIAGNOSIS — C50919 Malignant neoplasm of unspecified site of unspecified female breast: Secondary | ICD-10-CM | POA: Diagnosis present

## 2022-06-23 DIAGNOSIS — R7401 Elevation of levels of liver transaminase levels: Secondary | ICD-10-CM | POA: Diagnosis present

## 2022-06-23 DIAGNOSIS — K5900 Constipation, unspecified: Secondary | ICD-10-CM | POA: Diagnosis present

## 2022-06-23 DIAGNOSIS — N938 Other specified abnormal uterine and vaginal bleeding: Secondary | ICD-10-CM | POA: Diagnosis present

## 2022-06-23 DIAGNOSIS — Z803 Family history of malignant neoplasm of breast: Secondary | ICD-10-CM | POA: Diagnosis not present

## 2022-06-23 DIAGNOSIS — D72829 Elevated white blood cell count, unspecified: Secondary | ICD-10-CM | POA: Diagnosis present

## 2022-06-23 LAB — COMPREHENSIVE METABOLIC PANEL
ALT: 163 U/L — ABNORMAL HIGH (ref 0–44)
AST: 106 U/L — ABNORMAL HIGH (ref 15–41)
Albumin: 2.2 g/dL — ABNORMAL LOW (ref 3.5–5.0)
Alkaline Phosphatase: 354 U/L — ABNORMAL HIGH (ref 38–126)
Anion gap: 8 (ref 5–15)
BUN: 5 mg/dL — ABNORMAL LOW (ref 6–20)
CO2: 25 mmol/L (ref 22–32)
Calcium: 8.5 mg/dL — ABNORMAL LOW (ref 8.9–10.3)
Chloride: 102 mmol/L (ref 98–111)
Creatinine, Ser: 0.77 mg/dL (ref 0.44–1.00)
GFR, Estimated: >60 mL/min (ref >60)
Glucose, Bld: 101 mg/dL — ABNORMAL HIGH (ref 70–99)
Potassium: 4.2 mmol/L (ref 3.5–5.1)
Sodium: 135 mmol/L (ref 135–145)
Total Bilirubin: 0.4 mg/dL (ref 0.3–1.2)
Total Protein: 5.8 g/dL — ABNORMAL LOW (ref 6.5–8.1)

## 2022-06-23 LAB — CBC
HCT: 31.2 % — ABNORMAL LOW (ref 36.0–46.0)
Hemoglobin: 10.5 g/dL — ABNORMAL LOW (ref 12.0–15.0)
MCH: 29.5 pg (ref 26.0–34.0)
MCHC: 33.7 g/dL (ref 30.0–36.0)
MCV: 87.6 fL (ref 80.0–100.0)
Platelets: 527 10*3/uL — ABNORMAL HIGH (ref 150–400)
RBC: 3.56 MIL/uL — ABNORMAL LOW (ref 3.87–5.11)
RDW: 13 % (ref 11.5–15.5)
WBC: 15.1 10*3/uL — ABNORMAL HIGH (ref 4.0–10.5)
nRBC: 0 % (ref 0.0–0.2)

## 2022-06-23 MED ORDER — CYCLOBENZAPRINE HCL 10 MG PO TABS
5.0000 mg | ORAL_TABLET | Freq: Four times a day (QID) | ORAL | Status: DC | PRN
  Administered 2022-06-23 – 2022-06-25 (×2): 5 mg via ORAL
  Filled 2022-06-23 (×2): qty 1

## 2022-06-23 MED ORDER — POLYETHYLENE GLYCOL 3350 17 G PO PACK
17.0000 g | PACK | Freq: Every day | ORAL | Status: DC | PRN
  Administered 2022-06-23: 17 g via ORAL
  Filled 2022-06-23 (×2): qty 1

## 2022-06-23 MED ORDER — ACETAMINOPHEN 325 MG PO TABS
650.0000 mg | ORAL_TABLET | Freq: Four times a day (QID) | ORAL | Status: DC | PRN
  Administered 2022-06-24 – 2022-06-26 (×6): 650 mg via ORAL
  Filled 2022-06-23 (×6): qty 2

## 2022-06-23 MED ORDER — HYDROCODONE-ACETAMINOPHEN 5-325 MG PO TABS
1.0000 | ORAL_TABLET | ORAL | Status: DC | PRN
  Administered 2022-06-23 – 2022-06-24 (×2): 1 via ORAL
  Filled 2022-06-23 (×3): qty 1

## 2022-06-23 NOTE — Plan of Care (Signed)
  Problem: Education: Goal: Knowledge of General Education information will improve Description: Including pain rating scale, medication(s)/side effects and non-pharmacologic comfort measures 06/23/2022 2349 by Neldon Mc, RN Outcome: Progressing 06/23/2022 2034 by Neldon Mc, RN Outcome: Progressing   Problem: Health Behavior/Discharge Planning: Goal: Ability to manage health-related needs will improve 06/23/2022 2349 by Neldon Mc, RN Outcome: Progressing 06/23/2022 2034 by Neldon Mc, RN Outcome: Progressing   Problem: Clinical Measurements: Goal: Ability to maintain clinical measurements within normal limits will improve 06/23/2022 2349 by Neldon Mc, RN Outcome: Progressing 06/23/2022 2034 by Neldon Mc, RN Outcome: Progressing Goal: Will remain free from infection 06/23/2022 2349 by Neldon Mc, RN Outcome: Progressing 06/23/2022 2034 by Neldon Mc, RN Outcome: Progressing Goal: Diagnostic test results will improve 06/23/2022 2349 by Neldon Mc, RN Outcome: Progressing 06/23/2022 2034 by Neldon Mc, RN Outcome: Progressing Goal: Respiratory complications will improve 06/23/2022 2349 by Neldon Mc, RN Outcome: Progressing 06/23/2022 2034 by Neldon Mc, RN Outcome: Progressing Goal: Cardiovascular complication will be avoided 06/23/2022 2349 by Neldon Mc, RN Outcome: Progressing 06/23/2022 2034 by Neldon Mc, RN Outcome: Progressing   Problem: Activity: Goal: Risk for activity intolerance will decrease 06/23/2022 2349 by Neldon Mc, RN Outcome: Progressing 06/23/2022 2034 by Neldon Mc, RN Outcome: Progressing   Problem: Nutrition: Goal: Adequate nutrition will be maintained 06/23/2022 2349 by Neldon Mc, RN Outcome: Progressing 06/23/2022 2034 by Neldon Mc, RN Outcome: Progressing   Problem: Coping: Goal: Level of anxiety will decrease 06/23/2022  2349 by Neldon Mc, RN Outcome: Progressing 06/23/2022 2034 by Neldon Mc, RN Outcome: Progressing   Problem: Elimination: Goal: Will not experience complications related to bowel motility 06/23/2022 2349 by Neldon Mc, RN Outcome: Progressing 06/23/2022 2034 by Neldon Mc, RN Outcome: Progressing Goal: Will not experience complications related to urinary retention 06/23/2022 2349 by Neldon Mc, RN Outcome: Progressing 06/23/2022 2034 by Neldon Mc, RN Outcome: Progressing   Problem: Pain Managment: Goal: General experience of comfort will improve 06/23/2022 2349 by Neldon Mc, RN Outcome: Progressing 06/23/2022 2034 by Neldon Mc, RN Outcome: Progressing   Problem: Safety: Goal: Ability to remain free from injury will improve 06/23/2022 2349 by Neldon Mc, RN Outcome: Progressing 06/23/2022 2034 by Neldon Mc, RN Outcome: Progressing   Problem: Skin Integrity: Goal: Risk for impaired skin integrity will decrease 06/23/2022 2349 by Neldon Mc, RN Outcome: Progressing 06/23/2022 2034 by Neldon Mc, RN Outcome: Progressing

## 2022-06-23 NOTE — Progress Notes (Signed)
Patient reports some improvement in pain.  Pain is adequately controlled with Tylenol/Motrin.  No acute events overnight.     06/23/2022    8:15 AM 06/23/2022    4:43 AM 06/23/2022    3:35 AM  Vitals with BMI  Height     Weight  134 lbs 8 oz   BMI  25.42   Systolic 115 104 161  Diastolic 58 54 54  Pulse 78 84 84      Latest Ref Rng & Units 06/23/2022    5:25 AM 06/22/2022    8:04 PM 05/23/2022   12:18 PM  CBC  WBC 4.0 - 10.5 K/uL 15.1  17.0  7.6   Hemoglobin 12.0 - 15.0 g/dL 09.6  04.5  40.9   Hematocrit 36.0 - 46.0 % 31.2  35.0  40.4   Platelets 150 - 400 K/uL 527  665  456       Latest Ref Rng & Units 06/23/2022    5:25 AM 06/22/2022    8:04 PM 05/23/2022   12:18 PM  CMP  Glucose 70 - 99 mg/dL 811  914  782   BUN 6 - 20 mg/dL 5  <5  7   Creatinine 9.56 - 1.00 mg/dL 2.13  0.86  5.78   Sodium 135 - 145 mmol/L 135  134  137   Potassium 3.5 - 5.1 mmol/L 4.2  4.0  4.0   Chloride 98 - 111 mmol/L 102  99  103   CO2 22 - 32 mmol/L Calcium 8.9 - 10.3 mg/dL 8.5  9.1  9.4   Total Protein 6.5 - 8.1 g/dL 5.8  6.9  6.7   Total Bilirubin 0.3 - 1.2 mg/dL 0.4  0.7  0.4   Alkaline Phos 38 - 126 U/L 354  413  65   AST 15 - 41 U/L 106  101  44   ALT 0 - 44 U/L 163  186  60    Gen: A&O x 3 Abd: soft and no R/G.  LLQ pain improved on palpation but RLQ persists Ext: no c/c/e  HD2 52yo with PID -Cefotetan/Doxy-continue x 24-48 hours of clinical improvement.  Plan to d/c home with po abx (doxy/metro) for total of 14 days -Leukocytosis slightly improved 17.0->15.1 -Transaminitis likely secondary to pelvic infection stable -Repeat labs tomorrow AM -Continue Tylenol/Motrin prn pain -Plan outpatient repeat imaging -SCDs

## 2022-06-23 NOTE — Plan of Care (Signed)

## 2022-06-24 ENCOUNTER — Encounter (HOSPITAL_COMMUNITY): Payer: Self-pay | Admitting: Obstetrics & Gynecology

## 2022-06-24 LAB — COMPREHENSIVE METABOLIC PANEL
ALT: 162 U/L — ABNORMAL HIGH (ref 0–44)
AST: 96 U/L — ABNORMAL HIGH (ref 15–41)
Albumin: 2.1 g/dL — ABNORMAL LOW (ref 3.5–5.0)
Alkaline Phosphatase: 369 U/L — ABNORMAL HIGH (ref 38–126)
Anion gap: 11 (ref 5–15)
BUN: <5 mg/dL — ABNORMAL LOW (ref 6–20)
CO2: 26 mmol/L (ref 22–32)
Calcium: 9 mg/dL (ref 8.9–10.3)
Chloride: 100 mmol/L (ref 98–111)
Creatinine, Ser: 0.69 mg/dL (ref 0.44–1.00)
GFR, Estimated: >60 mL/min (ref >60)
Glucose, Bld: 97 mg/dL (ref 70–99)
Potassium: 4.4 mmol/L (ref 3.5–5.1)
Sodium: 137 mmol/L (ref 135–145)
Total Bilirubin: 0.2 mg/dL — ABNORMAL LOW (ref 0.3–1.2)
Total Protein: 5.7 g/dL — ABNORMAL LOW (ref 6.5–8.1)

## 2022-06-24 LAB — CBC WITH DIFFERENTIAL/PLATELET
Abs Immature Granulocytes: 0.09 10*3/uL — ABNORMAL HIGH (ref 0.00–0.07)
Basophils Absolute: 0 10*3/uL (ref 0.0–0.1)
Basophils Relative: 0 %
Eosinophils Absolute: 0.2 10*3/uL (ref 0.0–0.5)
Eosinophils Relative: 2 %
HCT: 31.8 % — ABNORMAL LOW (ref 36.0–46.0)
Hemoglobin: 10.7 g/dL — ABNORMAL LOW (ref 12.0–15.0)
Immature Granulocytes: 1 %
Lymphocytes Relative: 15 %
Lymphs Abs: 2 10*3/uL (ref 0.7–4.0)
MCH: 29.6 pg (ref 26.0–34.0)
MCHC: 33.6 g/dL (ref 30.0–36.0)
MCV: 87.8 fL (ref 80.0–100.0)
Monocytes Absolute: 1.4 10*3/uL — ABNORMAL HIGH (ref 0.1–1.0)
Monocytes Relative: 10 %
Neutro Abs: 10 10*3/uL — ABNORMAL HIGH (ref 1.7–7.7)
Neutrophils Relative %: 72 %
Platelets: 542 10*3/uL — ABNORMAL HIGH (ref 150–400)
RBC: 3.62 MIL/uL — ABNORMAL LOW (ref 3.87–5.11)
RDW: 13.2 % (ref 11.5–15.5)
WBC: 13.8 10*3/uL — ABNORMAL HIGH (ref 4.0–10.5)
nRBC: 0 % (ref 0.0–0.2)

## 2022-06-24 MED ORDER — FLEET ENEMA 7-19 GM/118ML RE ENEM
1.0000 | ENEMA | Freq: Once | RECTAL | Status: AC
  Administered 2022-06-24: 1 via RECTAL

## 2022-06-24 MED ORDER — CYCLOBENZAPRINE HCL 10 MG PO TABS
5.0000 mg | ORAL_TABLET | Freq: Every day | ORAL | Status: DC
  Administered 2022-06-24 – 2022-06-25 (×2): 5 mg via ORAL
  Filled 2022-06-24 (×2): qty 1

## 2022-06-24 MED ORDER — DOCUSATE SODIUM 100 MG PO CAPS
100.0000 mg | ORAL_CAPSULE | Freq: Every day | ORAL | Status: DC
  Administered 2022-06-24 – 2022-06-26 (×3): 100 mg via ORAL
  Filled 2022-06-24 (×3): qty 1

## 2022-06-24 MED ORDER — BISACODYL 10 MG RE SUPP
10.0000 mg | Freq: Every day | RECTAL | Status: DC | PRN
  Administered 2022-06-24: 10 mg via RECTAL
  Filled 2022-06-24: qty 1

## 2022-06-24 NOTE — Progress Notes (Signed)
Patient produced small BM with enema.

## 2022-06-24 NOTE — Progress Notes (Signed)
Patient states pain is currently well controlled with Motrin/Tylenol.  She did have worsened pain/pelvic pressure yesterday for which she took Vicodin which helped.  She also had HA which improved with Flexeril.  On questioning patient, she typically has regular BMs daily.  Last normal BM was Tuesday.  She had small BMs Wednesday-Friday but none since.  She reports she still has R>LLQ pain but now pain is moving and she feels "full."     06/24/2022    4:11 AM 06/23/2022   11:32 PM 06/23/2022    8:08 PM  Vitals with BMI  Systolic 99 117 129  Diastolic 51 67 73  Pulse 68 80 75      Latest Ref Rng & Units 06/24/2022    5:10 AM 06/23/2022    5:25 AM 06/22/2022    8:04 PM  CBC  WBC 4.0 - 10.5 K/uL 13.8  15.1  17.0   Hemoglobin 12.0 - 15.0 g/dL 16.1  09.6  04.5   Hematocrit 36.0 - 46.0 % 31.8  31.2  35.0   Platelets 150 - 400 K/uL 542  527  665       Latest Ref Rng & Units 06/24/2022    5:10 AM 06/23/2022    5:25 AM 06/22/2022    8:04 PM  CMP  Glucose 70 - 99 mg/dL 97  409  811   BUN 6 - 20 mg/dL <5  5  <5   Creatinine 0.44 - 1.00 mg/dL 9.14  7.82  9.56   Sodium 135 - 145 mmol/L 137  135  134   Potassium 3.5 - 5.1 mmol/L 4.4  4.2  4.0   Chloride 98 - 111 mmol/L 100  102  99   CO2 22 - 32 mmol/L Calcium 8.9 - 10.3 mg/dL 9.0  8.5  9.1   Total Protein 6.5 - 8.1 g/dL 5.7  5.8  6.9   Total Bilirubin 0.3 - 1.2 mg/dL 0.2  0.4  0.7   Alkaline Phos 38 - 126 U/L 369  354  413   AST 15 - 41 U/L 96  106  101   ALT 0 - 44 U/L 162  163  186    Gen: A&O x 3 Abd: soft and no R/G but distended.  Diffuse abdominal pain with palpation but worse in RLQ. Ext: no c/c/e   HD3 52yo with PID -D3 Cefotetan/Doxy-continue x 24-48 hours of clinical improvement.  Plan to d/c home with po abx (doxy/metro) for total of 14 days -Leukocytosis slightly improved 17.0->15.1->13.8 -Transaminitis likely secondary to pelvic infection slightly decreased -Constipation-Colace and Dulcolax suppository  ordered -Repeat labs tomorrow AM -Continue Tylenol/Motrin prn pain with Vicodin for breakthrough  -Plan outpatient repeat imaging -SCDs  Mitchel Honour, DO

## 2022-06-24 NOTE — Progress Notes (Signed)
Patient just returned from walk to cafeteria.  She states she was feeling much better but is feeling worse again after the walk.  She has not had BM yet today despite Colace, Miralax and Dulcolax suppository.    Will order Fleets enema.  Mitchel Honour, DO

## 2022-06-24 NOTE — Plan of Care (Signed)

## 2022-06-25 ENCOUNTER — Encounter: Payer: Self-pay | Admitting: *Deleted

## 2022-06-25 LAB — COMPREHENSIVE METABOLIC PANEL
ALT: 169 U/L — ABNORMAL HIGH (ref 0–44)
AST: 118 U/L — ABNORMAL HIGH (ref 15–41)
Albumin: 2 g/dL — ABNORMAL LOW (ref 3.5–5.0)
Alkaline Phosphatase: 348 U/L — ABNORMAL HIGH (ref 38–126)
Anion gap: 9 (ref 5–15)
BUN: 6 mg/dL (ref 6–20)
CO2: 28 mmol/L (ref 22–32)
Calcium: 8.9 mg/dL (ref 8.9–10.3)
Chloride: 100 mmol/L (ref 98–111)
Creatinine, Ser: 0.83 mg/dL (ref 0.44–1.00)
GFR, Estimated: >60 mL/min (ref >60)
Glucose, Bld: 99 mg/dL (ref 70–99)
Potassium: 4.5 mmol/L (ref 3.5–5.1)
Sodium: 137 mmol/L (ref 135–145)
Total Bilirubin: 0.3 mg/dL (ref 0.3–1.2)
Total Protein: 6 g/dL — ABNORMAL LOW (ref 6.5–8.1)

## 2022-06-25 LAB — CBC WITH DIFFERENTIAL/PLATELET
Abs Immature Granulocytes: 0.1 10*3/uL — ABNORMAL HIGH (ref 0.00–0.07)
Basophils Absolute: 0 10*3/uL (ref 0.0–0.1)
Basophils Relative: 0 %
Eosinophils Absolute: 0.3 10*3/uL (ref 0.0–0.5)
Eosinophils Relative: 2 %
HCT: 31 % — ABNORMAL LOW (ref 36.0–46.0)
Hemoglobin: 10.5 g/dL — ABNORMAL LOW (ref 12.0–15.0)
Immature Granulocytes: 1 %
Lymphocytes Relative: 15 %
Lymphs Abs: 2 10*3/uL (ref 0.7–4.0)
MCH: 29.9 pg (ref 26.0–34.0)
MCHC: 33.9 g/dL (ref 30.0–36.0)
MCV: 88.3 fL (ref 80.0–100.0)
Monocytes Absolute: 1.3 10*3/uL — ABNORMAL HIGH (ref 0.1–1.0)
Monocytes Relative: 9 %
Neutro Abs: 9.6 10*3/uL — ABNORMAL HIGH (ref 1.7–7.7)
Neutrophils Relative %: 73 %
Platelets: 561 10*3/uL — ABNORMAL HIGH (ref 150–400)
RBC: 3.51 MIL/uL — ABNORMAL LOW (ref 3.87–5.11)
RDW: 13.2 % (ref 11.5–15.5)
WBC: 13.2 10*3/uL — ABNORMAL HIGH (ref 4.0–10.5)
nRBC: 0 % (ref 0.0–0.2)

## 2022-06-25 MED ORDER — MAGNESIUM CITRATE PO SOLN
0.5000 | Freq: Once | ORAL | Status: AC
  Administered 2022-06-25: 0.5 via ORAL
  Filled 2022-06-25: qty 296

## 2022-06-25 MED ORDER — MAGNESIUM CITRATE PO SOLN
0.5000 | Freq: Once | ORAL | Status: AC
  Administered 2022-06-25 (×2): 0.5 via ORAL
  Filled 2022-06-25: qty 296

## 2022-06-25 NOTE — Procedures (Signed)
Is feeling much improved except no signif results from fleets  BP 117/68 (BP Location: Left Arm)   Pulse 78   Temp 98.3 F (36.8 C) (Oral)   Resp 18   Ht  (1.549 m)   Wt 61 kg   LMP 06/11/2022 (Approximate) Comment: "hasent stopped since feb"  SpO2 100%   BMI 25.41 kg/m   Abd + BS, nontender  Has now had 48 hrs IV ABX w/ clinical and lab improvement.  Will cont same and work on solution for constipation  Repeat labs in AM

## 2022-06-26 ENCOUNTER — Telehealth: Payer: Self-pay | Admitting: Hematology and Oncology

## 2022-06-26 LAB — CBC WITH DIFFERENTIAL/PLATELET
Abs Immature Granulocytes: 0.13 10*3/uL — ABNORMAL HIGH (ref 0.00–0.07)
Basophils Absolute: 0 10*3/uL (ref 0.0–0.1)
Basophils Relative: 0 %
Eosinophils Absolute: 0.4 10*3/uL (ref 0.0–0.5)
Eosinophils Relative: 3 %
HCT: 34.3 % — ABNORMAL LOW (ref 36.0–46.0)
Hemoglobin: 11.6 g/dL — ABNORMAL LOW (ref 12.0–15.0)
Immature Granulocytes: 1 %
Lymphocytes Relative: 17 %
Lymphs Abs: 2.1 10*3/uL (ref 0.7–4.0)
MCH: 29.7 pg (ref 26.0–34.0)
MCHC: 33.8 g/dL (ref 30.0–36.0)
MCV: 87.7 fL (ref 80.0–100.0)
Monocytes Absolute: 0.8 10*3/uL (ref 0.1–1.0)
Monocytes Relative: 6 %
Neutro Abs: 8.8 10*3/uL — ABNORMAL HIGH (ref 1.7–7.7)
Neutrophils Relative %: 73 %
Platelets: 730 10*3/uL — ABNORMAL HIGH (ref 150–400)
RBC: 3.91 MIL/uL (ref 3.87–5.11)
RDW: 13.2 % (ref 11.5–15.5)
WBC: 12.2 10*3/uL — ABNORMAL HIGH (ref 4.0–10.5)
nRBC: 0 % (ref 0.0–0.2)

## 2022-06-26 LAB — COMPREHENSIVE METABOLIC PANEL
ALT: 145 U/L — ABNORMAL HIGH (ref 0–44)
AST: 74 U/L — ABNORMAL HIGH (ref 15–41)
Albumin: 2.3 g/dL — ABNORMAL LOW (ref 3.5–5.0)
Alkaline Phosphatase: 407 U/L — ABNORMAL HIGH (ref 38–126)
Anion gap: 12 (ref 5–15)
BUN: <5 mg/dL — ABNORMAL LOW (ref 6–20)
CO2: 27 mmol/L (ref 22–32)
Calcium: 9.2 mg/dL (ref 8.9–10.3)
Chloride: 96 mmol/L — ABNORMAL LOW (ref 98–111)
Creatinine, Ser: 0.85 mg/dL (ref 0.44–1.00)
GFR, Estimated: >60 mL/min (ref >60)
Glucose, Bld: 108 mg/dL — ABNORMAL HIGH (ref 70–99)
Potassium: 4.1 mmol/L (ref 3.5–5.1)
Sodium: 135 mmol/L (ref 135–145)
Total Bilirubin: 0.3 mg/dL (ref 0.3–1.2)
Total Protein: 6.6 g/dL (ref 6.5–8.1)

## 2022-06-26 MED ORDER — NORETHIN ACE-ETH ESTRAD-FE 1-20 MG-MCG PO TABS: 1.0000 | ORAL_TABLET | Freq: Two times a day (BID) | ORAL | 1 refills | Status: DC

## 2022-06-26 MED ORDER — DOXYCYCLINE HYCLATE 100 MG PO CAPS: 100.0000 mg | ORAL_CAPSULE | Freq: Two times a day (BID) | ORAL | 0 refills | Status: AC

## 2022-06-26 MED ORDER — METRONIDAZOLE 500 MG PO TABS: 500.0000 mg | ORAL_TABLET | Freq: Two times a day (BID) | ORAL | 0 refills | Status: AC

## 2022-06-26 NOTE — Progress Notes (Signed)
   06/26/22 1016  Departure Condition  Departure Condition Good  Mobility at [facility]  Patient/Caregiver Teaching Teach Back Method Used;Discharge instructions reviewed;Prescriptions reviewed;Follow-up care reviewed;Pain management discussed;Medications discussed;Patient/caregiver verbalized understanding  Departure Mode With significant other  Was procedural sedation performed on this patient during this visit? No   Patient alert and oriented x4, VS and pain stable.

## 2022-06-26 NOTE — Discharge Summary (Signed)
Physician Discharge Summary  Patient ID: CAASI GIGLIA MRN: [id number] DOB/AGE: August 25, 1970 52 y.o.  Admit date: 06/22/2022 Discharge date: 06/26/2022  Admission Diagnoses:  Discharge Diagnoses:  Principal Problem:   Pelvic inflammatory disease   Discharged Condition: good  Hospital Course: adm with MR/lab evidence of PID, was sched for breast lumpectomy for stage I breast ca this wee. She had SHG 2 weeks ago and developed pelvic pain.  CBC on adm 18K with MRI consist w/ PID>>admitted to hospital for IV ABX. Was afeb throughout with improvement in pelvic pain. Has improvement in CBC parameters and also transaminitis inprovements. Had to receive Mag citrate x 2 for constipation. We discussed short term use of OCP BID to down regulate menst bleeding>>>other options later wouls be Mirena or EMA. I explained usuall Estrogen contraindicated w/ E+ breast cancer, but this is short course. Her lumpectomp has been r/s for 2 weeks  Consults: None  Significant Diagnostic Studies: labs: No results found for this or any previous visit (from the past 24 hour(s)).  Results for orders placed or performed during the hospital encounter of 06/22/22 (from the past 48 hour(s))  CBC with Differential/Platelet     Status: Abnormal   Collection Time: 06/25/22  5:32 AM  Result Value Ref Range   WBC 13.2 (H) 4.0 - 10.5 K/uL   RBC 3.51 (L) 3.87 - 5.11 MIL/uL   Hemoglobin 10.5 (L) 12.0 - 15.0 g/dL   HCT 40.9 (L) 81.1 - 91.4 %   MCV 88.3 80.0 - 100.0 fL   MCH 29.9 26.0 - 34.0 pg   MCHC 33.9 30.0 - 36.0 g/dL   RDW 78.2 95.6 - 21.3 %   Platelets 561 (H) 150 - 400 K/uL   nRBC 0.0 0.0 - 0.2 %   Neutrophils Relative % 73 %   Neutro Abs 9.6 (H) 1.7 - 7.7 K/uL   Lymphocytes Relative 15 %   Lymphs Abs 2.0 0.7 - 4.0 K/uL   Monocytes Relative 9 %   Monocytes Absolute 1.3 (H) 0.1 - 1.0 K/uL   Eosinophils Relative 2 %   Eosinophils Absolute 0.3 0.0 - 0.5 K/uL   Basophils Relative 0 %   Basophils Absolute 0.0  0.0 - 0.1 K/uL   Immature Granulocytes 1 %   Abs Immature Granulocytes 0.10 (H) 0.00 - 0.07 K/uL    Comment: Performed at [facility], [address] [place], [place] [address]  Comprehensive metabolic panel     Status: Abnormal   Collection Time: 06/25/22  5:32 AM  Result Value Ref Range   Sodium 137 135 - 145 mmol/L   Potassium 4.5 3.5 - 5.1 mmol/L   Chloride 100 98 - 111 mmol/L   CO2 28 22 - 32 mmol/L   Glucose, Bld 99 70 - 99 mg/dL    Comment: Glucose reference range applies only to samples taken after fasting for at least 8 hours.   BUN 6 6 - 20 mg/dL   Creatinine, Ser 8.46 0.44 - 1.00 mg/dL   Calcium 8.9 8.9 - 96.2 mg/dL   Total Protein 6.0 (L) 6.5 - 8.1 g/dL   Albumin 2.0 (L) 3.5 - 5.0 g/dL   AST 952 (H) 15 - 41 U/L   ALT 169 (H) 0 - 44 U/L   Alkaline Phosphatase 348 (H) 38 - 126 U/L   Total Bilirubin 0.3 0.3 - 1.2 mg/dL   GFR, Estimated >84 >13 mL/min    Comment: (NOTE) Calculated using the CKD-EPI Creatinine Equation (2021)  Anion gap 9 5 - 15    Comment: Performed at [facility], [address], [place], [place] [address]     Treatments: antibiotics: Ceftin/Vibra  Discharge Exam: Blood pressure 123/74, pulse 77, temperature 98.5 F (36.9 C), temperature source Oral, resp. rate 17, height  (1.549 m), weight 61 kg, last menstrual period 06/11/2022, SpO2 99 %. Alert and conversant , Abd soft, nontender  Disposition: Discharge disposition: 01-Home or Self Care          Follow-up Information     Richarda Overlie, MD Follow up in 2 day(s).   Specialty: Obstetrics and Gynecology Why: office will call Contact information: [address] [place] [place] [address] [phone]                 Signed: Meriel Pica 06/26/2022, 8:40 AM

## 2022-06-26 NOTE — Plan of Care (Signed)
  Problem: Education: Goal: Knowledge of General Education information will improve Description: Including pain rating scale, medication(s)/side effects and non-pharmacologic comfort measures 06/26/2022 1018 by Fernanda Drum, RN Outcome: Adequate for Discharge 06/26/2022 0630 by Fernanda Drum, RN Outcome: Adequate for Discharge   Problem: Health Behavior/Discharge Planning: Goal: Ability to manage health-related needs will improve 06/26/2022 1018 by Fernanda Drum, RN Outcome: Adequate for Discharge 06/26/2022 1601 by Fernanda Drum, RN Outcome: Adequate for Discharge   Problem: Clinical Measurements: Goal: Ability to maintain clinical measurements within normal limits will improve 06/26/2022 1018 by Fernanda Drum, RN Outcome: Adequate for Discharge 06/26/2022 0932 by Fernanda Drum, RN Outcome: Adequate for Discharge Goal: Will remain free from infection 06/26/2022 1018 by Fernanda Drum, RN Outcome: Adequate for Discharge 06/26/2022 3557 by Fernanda Drum, RN Outcome: Adequate for Discharge Goal: Diagnostic test results will improve 06/26/2022 1018 by Fernanda Drum, RN Outcome: Adequate for Discharge 06/26/2022 3220 by Fernanda Drum, RN Outcome: Adequate for Discharge Goal: Respiratory complications will improve 06/26/2022 1018 by Fernanda Drum, RN Outcome: Adequate for Discharge 06/26/2022 2542 by Fernanda Drum, RN Outcome: Adequate for Discharge Goal: Cardiovascular complication will be avoided 06/26/2022 1018 by Fernanda Drum, RN Outcome: Adequate for Discharge 06/26/2022 7062 by Fernanda Drum, RN Outcome: Adequate for Discharge   Problem: Activity: Goal: Risk for activity intolerance will decrease 06/26/2022 1018 by Fernanda Drum, RN Outcome: Adequate for Discharge 06/26/2022 3762 by Fernanda Drum, RN Outcome: Adequate for Discharge   Problem: Nutrition: Goal: Adequate nutrition will be  maintained 06/26/2022 1018 by Fernanda Drum, RN Outcome: Adequate for Discharge 06/26/2022 8315 by Fernanda Drum, RN Outcome: Adequate for Discharge   Problem: Coping: Goal: Level of anxiety will decrease 06/26/2022 1018 by Fernanda Drum, RN Outcome: Adequate for Discharge 06/26/2022 1761 by Fernanda Drum, RN Outcome: Adequate for Discharge   Problem: Elimination: Goal: Will not experience complications related to bowel motility 06/26/2022 1018 by Fernanda Drum, RN Outcome: Adequate for Discharge 06/26/2022 6073 by Fernanda Drum, RN Outcome: Adequate for Discharge Goal: Will not experience complications related to urinary retention 06/26/2022 1018 by Fernanda Drum, RN Outcome: Adequate for Discharge 06/26/2022 7106 by Fernanda Drum, RN Outcome: Adequate for Discharge   Problem: Pain Managment: Goal: General experience of comfort will improve 06/26/2022 1018 by Fernanda Drum, RN Outcome: Adequate for Discharge 06/26/2022 2694 by Fernanda Drum, RN Outcome: Adequate for Discharge   Problem: Safety: Goal: Ability to remain free from injury will improve 06/26/2022 1018 by Fernanda Drum, RN Outcome: Adequate for Discharge 06/26/2022 8546 by Fernanda Drum, RN Outcome: Adequate for Discharge   Problem: Skin Integrity: Goal: Risk for impaired skin integrity will decrease 06/26/2022 1018 by Fernanda Drum, RN Outcome: Adequate for Discharge 06/26/2022 2703 by Fernanda Drum, RN Outcome: Adequate for Discharge

## 2022-06-26 NOTE — Plan of Care (Signed)

## 2022-06-26 NOTE — Progress Notes (Signed)
Finally, good BM after second Mag Citrate, feeling much better. Still having menst type bleeding  BP 123/74 (BP Location: Left Arm)   Pulse 77   Temp 98.5 F (36.9 C) (Oral)   Resp 17   Ht  (1.549 m)   Wt 61 kg   LMP 06/11/2022 (Approximate) Comment: "hasent stopped since feb"  SpO2 99%   BMI 25.41 kg/m   Abd soft, nontender  Am labs just now drawn  Will plan D/C on PO ABX,

## 2022-06-28 DIAGNOSIS — Z1379 Encounter for other screening for genetic and chromosomal anomalies: Secondary | ICD-10-CM

## 2022-06-28 DIAGNOSIS — Z01818 Encounter for other preprocedural examination: Secondary | ICD-10-CM

## 2022-07-10 ENCOUNTER — Encounter (HOSPITAL_BASED_OUTPATIENT_CLINIC_OR_DEPARTMENT_OTHER): Payer: Self-pay | Admitting: Surgery

## 2022-07-10 ENCOUNTER — Other Ambulatory Visit: Payer: Self-pay

## 2022-07-10 NOTE — Progress Notes (Signed)
Chart reviewed with Dr. Mal Amabile, anesthesiologist, including recent ED visit and most recent platelet value of 730. Okay to proceed with surgery as planned pending significant changes to patient health and as long as patient platelets are not higher than 1000K/uL.

## 2022-07-12 ENCOUNTER — Ambulatory Visit: Payer: 59 | Admitting: Hematology and Oncology

## 2022-07-16 ENCOUNTER — Ambulatory Visit
Admission: RE | Admit: 2022-07-16 | Discharge: 2022-07-16 | Disposition: A | Discharge status: Home / Self Care | Payer: 59 | Source: Ambulatory Visit | Attending: Surgery | Admitting: Surgery

## 2022-07-16 DIAGNOSIS — C50911 Malignant neoplasm of unspecified site of right female breast: Secondary | ICD-10-CM

## 2022-07-16 HISTORY — PX: BREAST BIOPSY: SHX20

## 2022-07-16 NOTE — Anesthesia Preprocedure Evaluation (Signed)
Anesthesia Evaluation  Patient identified by MRN, date of birth, ID band Patient awake    Reviewed: Allergy & Precautions, NPO status , Patient's Chart, lab work & pertinent test results  History of Anesthesia Complications Negative for: history of anesthetic complications  Airway Mallampati: II  TM Distance: >3 FB Neck ROM: Full    Dental  (+) Dental Advisory Given   Pulmonary neg pulmonary ROS   Pulmonary exam normal breath sounds clear to auscultation       Cardiovascular negative cardio ROS  Rhythm:Regular Rate:Normal     Neuro/Psych negative neurological ROS     GI/Hepatic negative GI ROS, Neg liver ROS,,,  Endo/Other  negative endocrine ROS    Renal/GU negative Renal ROS     Musculoskeletal  (+) Arthritis ,    Abdominal   Peds  Hematology  (+) Blood dyscrasia, anemia   Anesthesia Other Findings Right breast cancer  Reproductive/Obstetrics                             Anesthesia Physical Anesthesia Plan  ASA: 2  Anesthesia Plan: General   Post-op Pain Management: Regional block* and Tylenol PO (pre-op)*   Induction: Intravenous  PONV Risk Score and Plan: 3 and Ondansetron, Dexamethasone and Treatment may vary due to age or medical condition  Airway Management Planned: LMA  Additional Equipment:   Intra-op Plan:   Post-operative Plan: Extubation in OR  Informed Consent: I have reviewed the patients History and Physical, chart, labs and discussed the procedure including the risks, benefits and alternatives for the proposed anesthesia with the patient or authorized representative who has indicated his/her understanding and acceptance.     Dental advisory given  Plan Discussed with: CRNA and Anesthesiologist  Anesthesia Plan Comments: (Risks of general anesthesia discussed including, but not limited to, sore throat, hoarse voice, chipped/damaged teeth, injury to vocal  cords, nausea and vomiting, allergic reactions, lung infection, heart attack, stroke, and death. All questions answered. )       Anesthesia Quick Evaluation

## 2022-07-17 ENCOUNTER — Ambulatory Visit (HOSPITAL_BASED_OUTPATIENT_CLINIC_OR_DEPARTMENT_OTHER)
Admission: RE | Admit: 2022-07-17 | Discharge: 2022-07-17 | Disposition: A | Discharge status: Home / Self Care | Payer: 59 | Source: Ambulatory Visit | Attending: Surgery | Admitting: Surgery

## 2022-07-17 ENCOUNTER — Ambulatory Visit (HOSPITAL_BASED_OUTPATIENT_CLINIC_OR_DEPARTMENT_OTHER): Payer: 59 | Admitting: Anesthesiology

## 2022-07-17 ENCOUNTER — Ambulatory Visit: Payer: Self-pay | Admitting: Surgery

## 2022-07-17 ENCOUNTER — Other Ambulatory Visit: Payer: Self-pay

## 2022-07-17 ENCOUNTER — Encounter (HOSPITAL_BASED_OUTPATIENT_CLINIC_OR_DEPARTMENT_OTHER): Payer: Self-pay | Admitting: Surgery

## 2022-07-17 ENCOUNTER — Ambulatory Visit
Admission: RE | Admit: 2022-07-17 | Discharge: 2022-07-17 | Disposition: A | Discharge status: Home / Self Care | Payer: 59 | Source: Ambulatory Visit | Attending: Surgery | Admitting: Surgery

## 2022-07-17 ENCOUNTER — Encounter (HOSPITAL_BASED_OUTPATIENT_CLINIC_OR_DEPARTMENT_OTHER)
Admission: RE | Disposition: A | Discharge status: Home / Self Care | Payer: Self-pay | Source: Ambulatory Visit | Attending: Surgery

## 2022-07-17 DIAGNOSIS — N6011 Diffuse cystic mastopathy of right breast: Secondary | ICD-10-CM | POA: Diagnosis not present

## 2022-07-17 DIAGNOSIS — C50911 Malignant neoplasm of unspecified site of right female breast: Secondary | ICD-10-CM

## 2022-07-17 DIAGNOSIS — D0511 Intraductal carcinoma in situ of right breast: Secondary | ICD-10-CM | POA: Insufficient documentation

## 2022-07-17 DIAGNOSIS — D630 Anemia in neoplastic disease: Secondary | ICD-10-CM | POA: Diagnosis not present

## 2022-07-17 DIAGNOSIS — M1990 Unspecified osteoarthritis, unspecified site: Secondary | ICD-10-CM | POA: Insufficient documentation

## 2022-07-17 DIAGNOSIS — N6021 Fibroadenosis of right breast: Secondary | ICD-10-CM | POA: Diagnosis not present

## 2022-07-17 DIAGNOSIS — Z01818 Encounter for other preprocedural examination: Secondary | ICD-10-CM

## 2022-07-17 DIAGNOSIS — Z170 Estrogen receptor positive status [ER+]: Secondary | ICD-10-CM | POA: Diagnosis not present

## 2022-07-17 DIAGNOSIS — Z1379 Encounter for other screening for genetic and chromosomal anomalies: Secondary | ICD-10-CM

## 2022-07-17 DIAGNOSIS — C50411 Malignant neoplasm of upper-outer quadrant of right female breast: Secondary | ICD-10-CM | POA: Diagnosis not present

## 2022-07-17 HISTORY — PX: BREAST LUMPECTOMY WITH RADIOACTIVE SEED AND SENTINEL LYMPH NODE BIOPSY: SHX6550

## 2022-07-17 LAB — POCT PREGNANCY, URINE: Preg Test, Ur: NEGATIVE

## 2022-07-17 SURGERY — BREAST LUMPECTOMY WITH RADIOACTIVE SEED AND SENTINEL LYMPH NODE BIOPSY
Anesthesia: General | Site: Breast | Laterality: Right

## 2022-07-17 MED ORDER — BUPIVACAINE HCL (PF) 0.25 % IJ SOLN
INTRAMUSCULAR | Status: DC | PRN
  Administered 2022-07-17: 30 mL via PERINEURAL

## 2022-07-17 MED ORDER — LACTATED RINGERS IV SOLN: INTRAVENOUS | Status: DC | PRN

## 2022-07-17 MED ORDER — MIDAZOLAM HCL 2 MG/2ML IJ SOLN
INTRAMUSCULAR | Status: AC
  Filled 2022-07-17: qty 2

## 2022-07-17 MED ORDER — IBUPROFEN 800 MG PO TABS: 800.0000 mg | ORAL_TABLET | Freq: Three times a day (TID) | ORAL | 0 refills | Status: DC | PRN

## 2022-07-17 MED ORDER — ONDANSETRON HCL 4 MG/2ML IJ SOLN
INTRAMUSCULAR | Status: DC | PRN
  Administered 2022-07-17: 4 mg via INTRAVENOUS

## 2022-07-17 MED ORDER — LIDOCAINE 2% (20 MG/ML) 5 ML SYRINGE
INTRAMUSCULAR | Status: AC
  Filled 2022-07-17: qty 5

## 2022-07-17 MED ORDER — PROPOFOL 500 MG/50ML IV EMUL
INTRAVENOUS | Status: DC | PRN
  Administered 2022-07-17: 35 ug/kg/min via INTRAVENOUS

## 2022-07-17 MED ORDER — ACETAMINOPHEN 500 MG PO TABS
1000.0000 mg | ORAL_TABLET | Freq: Once | ORAL | Status: AC
  Administered 2022-07-17: 1000 mg via ORAL

## 2022-07-17 MED ORDER — SODIUM CHLORIDE 0.9 % IV SOLN
INTRAVENOUS | Status: DC | PRN
  Administered 2022-07-17: 500 mL

## 2022-07-17 MED ORDER — MAGTRACE LYMPHATIC TRACER
INTRAMUSCULAR | Status: DC | PRN
  Administered 2022-07-17: 2 mL via INTRAMUSCULAR

## 2022-07-17 MED ORDER — EPHEDRINE SULFATE (PRESSORS) 50 MG/ML IJ SOLN
INTRAMUSCULAR | Status: DC | PRN
  Administered 2022-07-17: 10 mg via INTRAVENOUS

## 2022-07-17 MED ORDER — CHLORHEXIDINE GLUCONATE CLOTH 2 % EX PADS: 6.0000 | MEDICATED_PAD | Freq: Once | CUTANEOUS | Status: DC

## 2022-07-17 MED ORDER — FENTANYL CITRATE (PF) 100 MCG/2ML IJ SOLN: 25.0000 ug | INTRAMUSCULAR | Status: DC | PRN

## 2022-07-17 MED ORDER — MIDAZOLAM HCL 2 MG/2ML IJ SOLN
INTRAMUSCULAR | Status: DC | PRN
  Administered 2022-07-17: 2 mg via INTRAVENOUS

## 2022-07-17 MED ORDER — OXYCODONE HCL 5 MG PO TABS
5.0000 mg | ORAL_TABLET | Freq: Four times a day (QID) | ORAL | 0 refills | Status: DC | PRN
Start: 2022-07-17 — End: 2023-01-04

## 2022-07-17 MED ORDER — FENTANYL CITRATE (PF) 100 MCG/2ML IJ SOLN
INTRAMUSCULAR | Status: DC | PRN
  Administered 2022-07-17: 50 ug via INTRAVENOUS
  Administered 2022-07-17 (×2): 25 ug via INTRAVENOUS

## 2022-07-17 MED ORDER — OXYCODONE HCL 5 MG/5ML PO SOLN: 5.0000 mg | Freq: Once | ORAL | Status: DC | PRN

## 2022-07-17 MED ORDER — FENTANYL CITRATE (PF) 100 MCG/2ML IJ SOLN
INTRAMUSCULAR | Status: AC
  Filled 2022-07-17: qty 2

## 2022-07-17 MED ORDER — CEFAZOLIN SODIUM-DEXTROSE 2-4 GM/100ML-% IV SOLN
2.0000 g | INTRAVENOUS | Status: AC
  Administered 2022-07-17: 2 g via INTRAVENOUS

## 2022-07-17 MED ORDER — MIDAZOLAM HCL 2 MG/2ML IJ SOLN
2.0000 mg | Freq: Once | INTRAMUSCULAR | Status: AC
  Administered 2022-07-17: 2 mg via INTRAVENOUS

## 2022-07-17 MED ORDER — LACTATED RINGERS IV SOLN: INTRAVENOUS | Status: DC

## 2022-07-17 MED ORDER — ONDANSETRON HCL 4 MG/2ML IJ SOLN
INTRAMUSCULAR | Status: AC
  Filled 2022-07-17: qty 2

## 2022-07-17 MED ORDER — BUPIVACAINE-EPINEPHRINE (PF) 0.25% -1:200000 IJ SOLN
INTRAMUSCULAR | Status: DC | PRN
  Administered 2022-07-17: 30 mL via PERINEURAL

## 2022-07-17 MED ORDER — CEFAZOLIN SODIUM-DEXTROSE 2-4 GM/100ML-% IV SOLN
INTRAVENOUS | Status: AC
  Filled 2022-07-17: qty 100

## 2022-07-17 MED ORDER — OXYCODONE HCL 5 MG PO TABS: 5.0000 mg | ORAL_TABLET | Freq: Once | ORAL | Status: DC | PRN

## 2022-07-17 MED ORDER — ACETAMINOPHEN 500 MG PO TABS
ORAL_TABLET | ORAL | Status: AC
  Filled 2022-07-17: qty 2

## 2022-07-17 MED ORDER — PROPOFOL 10 MG/ML IV BOLUS
INTRAVENOUS | Status: DC | PRN
  Administered 2022-07-17: 100 mg via INTRAVENOUS
  Administered 2022-07-17: 200 mg via INTRAVENOUS

## 2022-07-17 MED ORDER — PROPOFOL 10 MG/ML IV BOLUS
INTRAVENOUS | Status: AC
  Filled 2022-07-17: qty 20

## 2022-07-17 MED ORDER — AMISULPRIDE (ANTIEMETIC) 5 MG/2ML IV SOLN: 10.0000 mg | Freq: Once | INTRAVENOUS | Status: DC | PRN

## 2022-07-17 MED ORDER — DEXAMETHASONE SODIUM PHOSPHATE 10 MG/ML IJ SOLN
INTRAMUSCULAR | Status: DC | PRN
  Administered 2022-07-17: 5 mg via INTRAVENOUS

## 2022-07-17 SURGICAL SUPPLY — 49 items
ADH SKN CLS APL DERMABOND .7 (GAUZE/BANDAGES/DRESSINGS) ×1
APL PRP STRL LF DISP 70% ISPRP (MISCELLANEOUS) ×1
APPLIER CLIP 9.375 MED OPEN (MISCELLANEOUS) ×1
APR CLP MED 9.3 20 MLT OPN (MISCELLANEOUS) ×1
BINDER BREAST LRG (GAUZE/BANDAGES/DRESSINGS) IMPLANT
BINDER BREAST MEDIUM (GAUZE/BANDAGES/DRESSINGS) IMPLANT
BINDER BREAST XLRG (GAUZE/BANDAGES/DRESSINGS) IMPLANT
BINDER BREAST XXLRG (GAUZE/BANDAGES/DRESSINGS) IMPLANT
BLADE SURG 15 STRL LF DISP TIS (BLADE) ×1 IMPLANT
BLADE SURG 15 STRL SS (BLADE) ×1
CANISTER SUC SOCK COL 7IN (MISCELLANEOUS) IMPLANT
CANISTER SUCT 1200ML W/VALVE (MISCELLANEOUS) ×1 IMPLANT
CHLORAPREP W/TINT 26 (MISCELLANEOUS) ×1 IMPLANT
CLIP APPLIE 9.375 MED OPEN (MISCELLANEOUS) ×1 IMPLANT
COVER BACK TABLE 60X90IN (DRAPES) ×1 IMPLANT
COVER MAYO STAND STRL (DRAPES) ×1 IMPLANT
COVER PROBE CYLINDRICAL 5X96 (MISCELLANEOUS) ×1 IMPLANT
DERMABOND ADVANCED .7 DNX12 (GAUZE/BANDAGES/DRESSINGS) ×1 IMPLANT
DRAPE LAPAROSCOPIC ABDOMINAL (DRAPES) ×1 IMPLANT
DRAPE UTILITY XL STRL (DRAPES) ×1 IMPLANT
ELECT COATED BLADE 2.86 ST (ELECTRODE) ×1 IMPLANT
ELECT REM PT RETURN 9FT ADLT (ELECTROSURGICAL) ×1
ELECTRODE REM PT RTRN 9FT ADLT (ELECTROSURGICAL) ×1 IMPLANT
GLOVE BIOGEL PI IND STRL 8 (GLOVE) ×1 IMPLANT
GLOVE ECLIPSE 8.0 STRL XLNG CF (GLOVE) ×1 IMPLANT
GOWN STRL REUS W/ TWL LRG LVL3 (GOWN DISPOSABLE) ×2 IMPLANT
GOWN STRL REUS W/ TWL XL LVL3 (GOWN DISPOSABLE) ×1 IMPLANT
GOWN STRL REUS W/TWL LRG LVL3 (GOWN DISPOSABLE) ×2
GOWN STRL REUS W/TWL XL LVL3 (GOWN DISPOSABLE) ×1
HEMOSTAT ARISTA ABSORB 3G PWDR (HEMOSTASIS) IMPLANT
HEMOSTAT SNOW SURGICEL 2X4 (HEMOSTASIS) IMPLANT
KIT MARKER MARGIN INK (KITS) ×1 IMPLANT
NDL HYPO 25X1 1.5 SAFETY (NEEDLE) ×1 IMPLANT
NDL SAFETY ECLIP 18X1.5 (MISCELLANEOUS) IMPLANT
NEEDLE HYPO 25X1 1.5 SAFETY (NEEDLE) ×1 IMPLANT
NS IRRIG 1000ML POUR BTL (IV SOLUTION) ×1 IMPLANT
PACK BASIN DAY SURGERY FS (CUSTOM PROCEDURE TRAY) ×1 IMPLANT
PENCIL SMOKE EVACUATOR (MISCELLANEOUS) ×1 IMPLANT
SLEEVE SCD COMPRESS KNEE MED (STOCKING) ×1 IMPLANT
SPIKE FLUID TRANSFER (MISCELLANEOUS) IMPLANT
SPONGE T-LAP 4X18 ~~LOC~~+RFID (SPONGE) ×1 IMPLANT
SUT MNCRL AB 4-0 PS2 18 (SUTURE) ×1 IMPLANT
SUT VICRYL 3-0 CR8 SH (SUTURE) ×1 IMPLANT
SYR CONTROL 10ML LL (SYRINGE) ×1 IMPLANT
TOWEL GREEN STERILE FF (TOWEL DISPOSABLE) ×1 IMPLANT
TRACER MAGTRACE VIAL (MISCELLANEOUS) IMPLANT
TRAY FAXITRON CT DISP (TRAY / TRAY PROCEDURE) ×1 IMPLANT
TUBE CONNECTING 20X1/4 (TUBING) ×1 IMPLANT
YANKAUER SUCT BULB TIP NO VENT (SUCTIONS) ×1 IMPLANT

## 2022-07-17 NOTE — Progress Notes (Signed)
Assisted Dr. Jennifer Allan with right, pectoralis, ultrasound guided block. Side rails up, monitors on throughout procedure. See vital signs in flow sheet. Tolerated Procedure well. 

## 2022-07-17 NOTE — Discharge Instructions (Addendum)
[facility] Office Phone Number [phone]  BREAST BIOPSY/ PARTIAL MASTECTOMY: POST OP INSTRUCTIONS  Always review your discharge instruction sheet given to you by the facility where your surgery was performed.  IF YOU HAVE DISABILITY OR FAMILY LEAVE FORMS, YOU MUST BRING THEM TO THE OFFICE FOR PROCESSING.  DO NOT GIVE THEM TO YOUR DOCTOR.  A prescription for pain medication may be given to you upon discharge.  Take your pain medication as prescribed, if needed.  If narcotic pain medicine is not needed, then you may take acetaminophen (Tylenol) or ibuprofen (Advil) as needed. Take your usually prescribed medications unless otherwise directed If you need a refill on your pain medication, please contact your pharmacy.  They will contact our office to request authorization.  Prescriptions will not be filled after 5pm or on week-ends. You should eat very light the first 24 hours after surgery, such as soup, crackers, pudding, etc.  Resume your normal diet the day after surgery. Most patients will experience some swelling and bruising in the breast.  Ice packs and a good support bra will help.  Swelling and bruising can take several days to resolve.  It is common to experience some constipation if taking pain medication after surgery.  Increasing fluid intake and taking a stool softener will usually help or prevent this problem from occurring.  A mild laxative (Milk of Magnesia or Miralax) should be taken according to package directions if there are no bowel movements after 48 hours. Unless discharge instructions indicate otherwise, you may remove your bandages 24-48 hours after surgery, and you may shower at that time.  You may have steri-strips (small skin tapes) in place directly over the incision.  These strips should be left on the skin for 7-10 days.  If your surgeon used skin glue on the incision, you may shower in 24 hours.  The glue will flake off over the next 2-3 weeks.  Any  sutures or staples will be removed at the office during your follow-up visit. ACTIVITIES:  You may resume regular daily activities (gradually increasing) beginning the next day.  Wearing a good support bra or sports bra minimizes pain and swelling.  You may have sexual intercourse when it is comfortable. You may drive when you no longer are taking prescription pain medication, you can comfortably wear a seatbelt, and you can safely maneuver your car and apply brakes. RETURN TO WORK:  ______________________________________________________________________________________ You should see your doctor in the office for a follow-up appointment approximately two weeks after your surgery.  Your doctor's nurse will typically make your follow-up appointment when she calls you with your pathology report.  Expect your pathology report 2-3 business days after your surgery.  You may call to check if you do not hear from us after three days. OTHER INSTRUCTIONS: _______________________________________________________________________________________________ _____________________________________________________________________________________________________________________________________ _____________________________________________________________________________________________________________________________________ _____________________________________________________________________________________________________________________________________  WHEN TO CALL YOUR DOCTOR: Fever over 101.0 Nausea and/or vomiting. Extreme swelling or bruising. Continued bleeding from incision. Increased pain, redness, or drainage from the incision.  The clinic staff is available to answer your questions during regular business hours.  Please don't hesitate to call and ask to speak to one of the nurses for clinical concerns.  If you have a medical emergency, go to the nearest emergency room or call 911.  A surgeon from [facility] is always on call at the hospital.  For further questions, please visit [email or web address]    Post Anesthesia Home Care Instructions  Activity: Get plenty of rest for the remainder of   of the day. A responsible individual must stay with you for 24 hours following the procedure.  For the next 24 hours, DO NOT: -Drive a car -Advertising copywriter -Drink alcoholic beverages -Take any medication unless instructed by your physician -Make any legal decisions or sign important papers.  Meals: Start with liquid foods such as gelatin or soup. Progress to regular foods as tolerated. Avoid greasy, spicy, heavy foods. If nausea and/or vomiting occur, drink only clear liquids until the nausea and/or vomiting subsides. Call your physician if vomiting continues.  Special Instructions/Symptoms: Your throat may feel dry or sore from the anesthesia or the breathing tube placed in your throat during surgery. If this causes discomfort, gargle with warm salt water. The discomfort should disappear within 24 hours.  If you had a scopolamine patch placed behind your ear for the management of post- operative nausea and/or vomiting:  1. The medication in the patch is effective for 72 hours, after which it should be removed.  Wrap patch in a tissue and discard in the trash. Wash hands thoroughly with soap and water. 2. You may remove the patch earlier than 72 hours if you experience unpleasant side effects which may include dry mouth, dizziness or visual disturbances. 3. Avoid touching the patch. Wash your hands with soap and water after contact with the patch.    Can have tylenol after 7:11 pm if needed

## 2022-07-17 NOTE — Transfer of Care (Signed)
Immediate Anesthesia Transfer of Care Note  Patient: Andrea Johns  Procedure(s) Performed: RIGHT BREAST LUMPECTOMY WITH RADIOACTIVE SEED AND SENTINEL LYMPH NODE BIOPSY (Right: Breast)  Patient Location: PACU  Anesthesia Type:GA combined with regional for post-op pain  Level of Consciousness: awake, alert , oriented, drowsy, and patient cooperative  Airway & Oxygen Therapy: Patient Spontanous Breathing and Patient connected to face mask oxygen  Post-op Assessment: Report given to RN and Post -op Vital signs reviewed and stable  Post vital signs: Reviewed and stable  Last Vitals:  Vitals Value Taken Time  BP    Temp    Pulse 86 07/17/22 1559  Resp    SpO2 98 % 07/17/22 1559  Vitals shown include unvalidated device data.  Last Pain:  Vitals:   07/17/22 1308  TempSrc: Temporal  PainSc: 7          Complications: No notable events documented.

## 2022-07-17 NOTE — Interval H&P Note (Signed)
History and Physical Interval Note:  07/17/2022 2:10 PM  Andrea Johns  has presented today for surgery, with the diagnosis of RIGHT BREAST CANCER.  The various methods of treatment have been discussed with the patient and family. After consideration of risks, benefits and other options for treatment, the patient has consented to  Procedure(s): RIGHT BREAST LUMPECTOMY WITH RADIOACTIVE SEED AND SENTINEL LYMPH NODE BIOPSY (Right) as a surgical intervention.  The patient's history has been reviewed, patient examined, no change in status, stable for surgery.  I have reviewed the patient's chart and labs.  Questions were answered to the patient's satisfaction.   The procedure has been discussed with the patient. Alternatives to surgery have been discussed with the patient.  Risks of surgery include bleeding,  Infection,  Seroma formation, death,  and the need for further surgery.   The patient understands and wishes to proceed. Sentinel lymph node mapping and dissection has been discussed with the patient.  Risk of bleeding,  Infection,  Seroma formation,  Additional procedures,,  Shoulder weakness ,  Shoulder stiffness,  Nerve and blood vessel injury and reaction to the mapping dyes have been discussed.  Alternatives to surgery have been discussed with the patient.  The patient agrees to proceed.   Luane Rochon A Elmore Hyslop

## 2022-07-17 NOTE — Anesthesia Procedure Notes (Signed)
Anesthesia Regional Block: Pectoralis block   Pre-Anesthetic Checklist: , timeout performed,  Correct Patient, Correct Site, Correct Laterality,  Correct Procedure, Correct Position, site marked,  Risks and benefits discussed,  Surgical consent,  Pre-op evaluation,  At surgeon's request and post-op pain management  Laterality: Right  Prep: chloraprep       Needles:  Injection technique: Single-shot  Needle Type: Echogenic Stimulator Needle     Needle Length: 9cm  Needle Gauge: 21     Additional Needles:   Procedures:,,,, ultrasound used (permanent image in chart),,    Narrative:  Start time: 07/17/2022 1:37 PM End time: 07/17/2022 1:40 PM Injection made incrementally with aspirations every 5 mL.  Performed by: Personally  Anesthesiologist: Linton Rump, MD  Additional Notes: Discussed risks and benefits of nerve block including, but not limited to, prolonged and/or permanent nerve injury involving sensory and/or motor function. Monitors were applied and a time-out was performed. The nerve and associated structures were visualized under ultrasound guidance. After negative aspiration, local anesthetic was slowly injected around the nerve. There was no evidence of high pressure during the procedure. There were no paresthesias. VSS remained stable and the patient tolerated the procedure well.

## 2022-07-17 NOTE — Anesthesia Postprocedure Evaluation (Signed)
Anesthesia Post Note  Patient: Andrea Johns  Procedure(s) Performed: RIGHT BREAST LUMPECTOMY WITH RADIOACTIVE SEED AND SENTINEL LYMPH NODE BIOPSY (Right: Breast)     Patient location during evaluation: PACU Anesthesia Type: General Level of consciousness: awake Pain management: pain level controlled Vital Signs Assessment: post-procedure vital signs reviewed and stable Respiratory status: spontaneous breathing, nonlabored ventilation and respiratory function stable Cardiovascular status: blood pressure returned to baseline and stable Postop Assessment: no apparent nausea or vomiting Anesthetic complications: no   No notable events documented.  Last Vitals:  Vitals:   07/17/22 1630 07/17/22 1645  BP: (!) 147/79 (!) 152/68  Pulse:  80  Resp: 19 16  Temp:  36.9 C  SpO2:  97%    Last Pain:  Vitals:   07/17/22 1645  TempSrc:   PainSc: 1                  Linton Rump

## 2022-07-17 NOTE — Op Note (Signed)
Prep diagnosis: Right breast cancer upper outer quadrant stage I  Postoperative diagnosis: Same  Procedure: Right breast seed localized lumpectomy with right axillary sentinel lymph node mapping of deep right axillary sentinel nodes using mag trace  Surgeon: Harriette Bouillon, MD  Anesthesia: LMA with right pectoral block and 0.25% Marcaine plain  Drains: None  Specimen: Right breast tissue with seed and clip verified by Faxitron and 3 right axillary sentinel nodes deep  Indications for procedure: The patient is a 52 year old female with stage I right breast cancer.  She was seen in the multidisciplinary clinic and options were discussed of breast conserving surgery versus mastectomy with reconstruction.  We reviewed all options and she chose to undergo a lumpectomy with sentinel lymph node mapping of deep right axillary sentinel nodes.The procedure has been discussed with the patient. Alternatives to surgery have been discussed with the patient.  Risks of surgery include bleeding,  Infection,  Seroma formation, death,  and the need for further surgery.   The patient understands and wishes to proceed. Sentinel lymph node mapping and dissection has been discussed with the patient.  Risk of bleeding,  Infection,  Seroma formation,  Additional procedures,,  Shoulder weakness ,  Shoulder stiffness,  Nerve and blood vessel injury and reaction to the mapping dyes have been discussed.  Alternatives to surgery have been discussed with the patient.  The patient agrees to proceed.    Description of procedure: The patient was met in the holding area and questions were answered.  A right pectoral block was placed by anesthesia.  Seed placement was done before arriving at the outpatient radiology clinic.  Right side was marked as correct site.  All questions answered.  She is taken back to the operating room.  She was placed supine upon the operating table.  After induction of general anesthesia, right breast was  prepped and draped in sterile fashion and timeout performed.  Proper patient, site and procedure verified.  2 cc of mag trace were injected and massaged for 5 minutes into the right subareolar complex.  Neoprobe was used to identify the seed in the right upper central breast just above the nipple areolar complex.  Curvilinear incision was made along the superior border of the nipple areolar complex.  Dissection was carried down all tissue and the seed and clip were excised with a grossly negative margin.  The Faxitron image revealed the seed and clip to be present.  The tissue of note was oriented with ink and sent to pathology.  Gross margins were negative.  The cavity was irrigated.  Clips were placed.  Local anesthetic infiltrated in the deep tissue planes closed with 3-0 Vicryl.  4 Monocryl used to close skin.  The mag trace probe was used.  Hotspot identified in her axilla.  Incision was made and dissection was carried down into the level 1 deep axillary contents.  There were 3 nodes had taken up the tracer and showed a spike on the machine.  These were removed.  There were no other spikes noted throughout the axilla.  No other enlarged lymph nodes noted.  Long thoracic nerve, thoracodorsal trunk and axillary vein were preserved.  Irrigation was used.  Arista placed.  Deep tissue planes closed with 3-0 Vicryl.  4 Monocryl used to close skin.  Dermabond applied.  All counts were found to be correct.  Breast binder placed.  The patient was awoke extubated taken recovery in satisfactory condition.

## 2022-07-17 NOTE — H&P (Signed)
History of Present Illness: Andrea Johns is a 52 y.o. female who is seen today as an office consultation for evaluation of Breast Cancer  Patient presents for evaluation of abnormal mammogram. She went for screening mammogram was found to have a 1 cm mass at the 12 o'clock position right breast core biopsy proven to be invasive ductal carcinoma with DCIS. She was ER positive PR positive HER2/neu negative. She had no history of breast pain nipple discharge or breast mass.  Review of Systems: A complete review of systems was obtained from the patient. I have reviewed this information and discussed as appropriate with the patient. See HPI as well for other ROS.    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)  Chronic pain of both knees  Chondromalacia of left patella  Female infertility  Nasal congestion   Past Surgical History:  Procedure Laterality Date  .eye sx 05/17/2022  .hand sx  Unknown date  .tubal ligation  Unknown date    Allergies  Allergen Reactions  Codeine Other (See Comments), Nausea And Vomiting and Vomiting  Sulfamethoxazole-Trimethoprim Hives and Nausea And Vomiting  Ciprofloxacin Other (See Comments), Nausea, Nausea And Vomiting and Vomiting  Latex Dermatitis and Itching   Current Outpatient Medications on File Prior to Visit  Medication Sig Dispense Refill  cephalexin (KEFLEX) 500 MG capsule Take 500 mg by mouth 4 (four) times daily  ibuprofen (MOTRIN) 200 MG tablet Take 200 mg by mouth every 6 (six) hours as needed  traMADol-acetaminophen (ULTRACET) 37.5-325 mg tablet Take 1 tablet by mouth every 6 (six) hours as needed   No current facility-administered medications on file prior to visit.   No family history on file.   Social  History   Tobacco Use  Smoking Status Never  Smokeless Tobacco Never    Social History   Socioeconomic History  Marital status: Married  Tobacco Use  Smoking status: Never  Smokeless tobacco: Never  Vaping Use  Vaping Use: Never used  Substance and Sexual Activity  Alcohol use: Never  Drug use: Never   Objective:  There were no vitals filed for this visit.  There is no height or weight on file to calculate BMI.  Physical Exam Exam conducted with a chaperone present.  HENT:  Head: Normocephalic.  Pulmonary:  Effort: Pulmonary effort is normal.  Chest:  Breasts: Right: Normal.  Left: Normal.   Comments: Bruising right breast Musculoskeletal:  General: Normal range of motion.  Cervical back: Normal range of motion.  Lymphadenopathy:  Upper Body:  Right upper body: No supraclavicular or axillary adenopathy.  Left upper body: No supraclavicular or axillary adenopathy.  Skin: General: Skin is warm.  Neurological:  General: No focal deficit present.  Mental Status: She is alert.  Psychiatric:  Mood and Affect: Mood normal.  Behavior: Behavior normal.     Labs, Imaging and Diagnostic Testing: Diagnosis 1. Breast, right, needle core biopsy, 12 o'clock - INVASIVE DUCTAL CARCINOMA, SEE NOTE - FOCAL DUCTAL CARCINOMA IN SITU, NUCLEAR GRADE 1 OF 3 - TUBULE FORMATION: SCORE 1 OF 3 - NUCLEAR PLEOMORPHISM: SCORE 1 OF 3 - MITOTIC COUNT: SCORE 1 OF 3 - TOTAL SCORE: 3 OF 9 - OVERALL GRADE: I OF III - LYMPHOVASCULAR INVASION: NOT IDENTIFIED - CANCER LENGTH: 6 MM IN GREATEST LINEAR DIMENSION ON FRAGMENTED CORES. -  CALCIFICATIONS: EXTENSIVELY PRESENT - OTHER FINDINGS: N/A NOTE: DR. [facility] REVIEWED THE CASE AND CONCURS WITH THE INTERPRETATION. A BREAST PROGNOSTIC PROFILE (ER, PR, KI-67 AND HER2) IS PENDING AND WILL BE REPORTED IN AN ADDENDUM. THE BREAST CENTER OF 1 of 4 FINAL for [name], Andrea Johns ([id number]) Diagnosis(continued) [name] WAS NOTIFIED ON  05/18/2022. 2. Breast, right, needle core biopsy, medial - BENIGN BREAST TISSUE WITH FOCAL FIBROCYSTIC CHANGE AND MILDLY INCREASED VASCULARITY. NOTE: A DEFINITIVE PATHOLOGIC CORRELATE TO A LESION IS NOT IDENTIFIED, CLINICAL/RADIOLOGIC CORRELATION NECESSARY. Andrea Desanctis DO Pathologist, Electronic Signature (Case signed 05/18/2022) Specimen Gross and Clinical Information Specimen Comment 1. TIF: 8:20 AM, CIT < 30 sec; mass 2. TIF: 9:20 AM, CIT < 5 min; asymmetry Specimen(s) Obtained: 1. Breast, right, needle core biopsy, 12 o'clock 2. Breast, right, needle core biopsy, medial Specimen Clinical Information 1. C/F IDC 2. PASH vs IDC Gross 1. In formalin labeled Andrea Johns and "right breast 12 o'clock" (time in formalin 0820 hours and CIT less than 30 seconds) are three cores of gray white to yellow soft to firm tissue which range from 1 x 0.1 x 0.1 cm to 1.4 x 0.1 x 0.1 cm, submitted in one block. 2. In formalin labeled Andrea Johns and "right breast" (time in formalin 0920 hours and CIT less than 5 minutes) are multiple cores and irregular pieces of yellow pink to red soft tissue, 2.4 x 2 x 0.3 cm in aggregate. Entirely submitted in two blocks. (SSW:kh 05/17/22) Stain(s) used in Diagnosis: The following stain(s) were used in diagnosing the case: Her2 FISH, PR-ACIS, Her2 by IHC, KI-67-ACIS, ER-ACIS. The control(s) stained appropriately. ADDITIONAL INFORMATION: 1. 1A-Breast, right, needle core biopsy FLUORESCENCE IN-SITU HYBRIDIZATION Results: GROUP 5: HER2 **NEGATIVE** Equivocal form of amplification of the HER2 gene was detected in the IHC 2+ tissue sample received from this individual. HER2 FISH was performed by a technologist and cell imaging and analysis on the [employer]. 2 of 4 FINAL for [name], Andrea Johns ([id number]) ADDITIONAL INFORMATION:(continued) RATIO OF HER2/CEN17 SIGNALS 1.05 AVERAGE HER2 COPY NUMBER PER CELL 2.00 The ratio of HER2/CEN 17 is within the range  < 2.0 of HER2/CEN 17 and a copy number of HER2 signals per cell is <4.0. Arch Pathol Lab Med 1:1,2018 Jimmy Picket MD Pathologist, Electronic Signature ( Signed 05/23/2022) 1. Breast, right, needle core biopsy, 12 o'clock PROGNOSTIC INDICATORS Results: IMMUNOHISTOCHEMICAL AND MORPHOMETRIC ANALYSIS PERFORMED MANUALLY The tumor cells are equivocal for Her2 (2+). Her2 by FISH will be performed and the results reported separately. Estrogen Receptor: 80%, POSITIVE, MODERATE STAINING INTENSITY Progesterone Receptor: 100%, POSITIVE, STRONG STAINING INTENSITY Proliferation Marker Ki67: 5% REFERENCE RANGE ESTROGEN RECEPTOR NEGATIVE 0% POSITIVE =>1% REFERENCE RANGE PROGESTERONE RECEPTOR NEGATIVE 0% POSITIVE =>1% All controls stained appropriately Jimmy Picket MD Pathologist, Electronic Signature ( Signed 05/21/2022) 3"- CLINICAL DATA: Patient recalled from screening for right breast asymmetries.  EXAM: DIGITAL DIAGNOSTIC UNILATERAL RIGHT MAMMOGRAM WITH TOMOSYNTHESIS; ULTRASOUND RIGHT BREAST LIMITED  TECHNIQUE: Right digital diagnostic mammography and breast tomosynthesis was performed.; Targeted ultrasound examination of the right breast was performed  COMPARISON: Previous exam(s).  ACR Breast Density Category c: The breasts are heterogeneously dense, which may obscure small masses.  FINDINGS: Within the anterior right breast there is a persistent irregular asymmetry with associated amorphous calcifications, further evaluated with spot compression and magnification views.  Within the medial right breast middle to posterior depth there is a persistent focal asymmetry demonstrated best on the spot cc views.  Targeted ultrasound is performed, showing a 7 x 7 x 6 mm irregular  hypoechoic mass right breast 12 o'clock position 3 cm from nipple corresponding with the asymmetry within the anterior right breast with associated calcifications.  No definite sonographic correlate for  the asymmetry within the posteromedial right breast.  No right axillary adenopathy.  IMPRESSION: Suspicious right breast mass 12 o'clock position.  Indeterminate sonographically occult asymmetry within the medial posterior right breast.  RECOMMENDATION: Ultrasound-guided core needle biopsy right breast mass 12 o'clock position.  Stereotactic guided core needle biopsy sonographically occult asymmetry within the medial posterior right breast demonstrated best on the spot cc view.  I have discussed the findings and recommendations with the patient. If applicable, a reminder letter will be sent to the patient regarding the next appointment.  BI-RADS CATEGORY 4: Suspicious.   Electronically Signed By: Annia Belt M.D.  Assessment and Plan:   Diagnoses and all orders for this visit:  Malignant neoplasm of upper-outer quadrant of right breast in female, estrogen receptor positive    Discussed breast conserving surgery with with sentinel lymph node mapping as well as mastectomy reconstruction. She is a good breast conserving candidate and is opted for right breast seed localized lumpectomy with right axillary sentinel lymph node mapping. Risk, benefits and other alternatives were reviewed today as well as more aggressive surgery with reconstruction. Long-term survival, local regional recurrence, and quality of life issues were reviewed as well today. Complications of bleeding infection lymphedema, blood vessel injury, nerve injury, numbness, shoulder stiffness, recurrence, reexcision, discoloration, as well as use of seed in tracer discussed. She agrees to proceed.   Hayden Rasmussen, MD

## 2022-07-17 NOTE — Anesthesia Procedure Notes (Signed)
Procedure Name: LMA Insertion Date/Time: 07/17/2022 2:44 PM  Performed by: Karen Kitchens, CRNAPre-anesthesia Checklist: Patient identified, Emergency Drugs available, Suction available and Patient being monitored Patient Re-evaluated:Patient Re-evaluated prior to induction Oxygen Delivery Method: Circle system utilized Preoxygenation: Pre-oxygenation with 100% oxygen Induction Type: IV induction Ventilation: Mask ventilation without difficulty LMA: LMA inserted LMA Size: 3.0 Number of attempts: 1 Airway Equipment and Method: Bite block Placement Confirmation: positive ETCO2, CO2 detector and breath sounds checked- equal and bilateral Tube secured with: Tape Dental Injury: Teeth and Oropharynx as per pre-operative assessment

## 2022-07-18 ENCOUNTER — Encounter (HOSPITAL_BASED_OUTPATIENT_CLINIC_OR_DEPARTMENT_OTHER): Payer: Self-pay | Admitting: Surgery

## 2022-07-24 NOTE — Progress Notes (Signed)
Patient Care Team: Mila Palmer, MD as PCP - General (Family Medicine) Serena Croissant, MD as Consulting Physician (Hematology and Oncology) Almond Lint, MD as Consulting Physician (General Surgery) Dorothy Puffer, MD as Consulting Physician (Radiation Oncology) Pershing Proud, RN as Oncology Nurse Navigator Donnelly Angelica, RN as Oncology Nurse Navigator  DIAGNOSIS:  Encounter Diagnosis  Name Primary?   Malignant neoplasm of upper-outer quadrant of right breast in female, estrogen receptor positive (HCC) Yes    SUMMARY OF ONCOLOGIC HISTORY: Oncology History  Malignant neoplasm of upper-outer quadrant of right breast in female, estrogen receptor positive (HCC)  05/17/2022 Initial Diagnosis   Screening mammogram detected right breast asymmetry 12 o'clock position anteriorly measuring 0.7 cm, posteriorly there was enhancement (stereotactic biopsy: Benign) biopsy of the anterior abnormality revealed grade 1 IDC with low-grade DCIS and calcifications ER 80%, PR 100%, HER2 equivocal by IHC, FISH negative, Ki-67 5%   05/21/2022 Cancer Staging   Staging form: Breast, AJCC 8th Edition - Clinical stage from 05/21/2022: cT1b, cN0, cM0, G1 - Signed by Serena Croissant, MD on 05/23/2022 Stage prefix: Initial diagnosis Method of lymph node assessment: Clinical Histologic grading system: 3 grade system    Genetic Testing   [facility] Common Cancer Panel+RNA was Negative. Report date is 05/29/2022.  The Common Hereditary Cancers Panel offered by [facility] includes sequencing and/or deletion duplication testing of the following 48 genes: APC, ATM, AXIN2, BAP1, BARD1, BMPR1A, BRCA1, BRCA2, BRIP1, CDH1, CDK4, CDKN2A (p14ARF and p16INK4a only), CHEK2, CTNNA1, DICER1, EPCAM (Deletion/duplication testing only), FH, GREM1 (promoter region duplication testing only), HOXB13, KIT, MBD4, MEN1, MLH1, MSH2, MSH3, MSH6, MUTYH, NF1, NHTL1, PALB2, PDGFRA, PMS2, POLD1, POLE, PTEN, RAD51C, RAD51D, SDHA (sequencing analysis  only except exon 14), SDHB, SDHC, SDHD, SMAD4, SMARCA4. STK11, TP53, TSC1, TSC2, and VHL.     CHIEF COMPLIANT: Follow-up after surgery  INTERVAL HISTORY: Andrea Johns is a  52 y.o. female is here because of recent diagnosis of right breast cancer.  She presents to the clinic for a follow-up. She reports that the ovarian cyst on right side has shrunk. The one on the left said subsided.  She is healing and recovering very well from the recent lumpectomy surgery.  She is here to discuss adjuvant treatment plan.  She was informed by GYN that the infection has subsided.    ALLERGIES:  is allergic to sulfamethoxazole-trimethoprim, ciprofloxacin, codeine, and latex.  MEDICATIONS:  Current Outpatient Medications  Medication Sig Dispense Refill   ibuprofen (ADVIL) 800 MG tablet Take 1 tablet (800 mg total) by mouth every 8 (eight) hours as needed. 30 tablet 0   ibuprofen (ADVIL,MOTRIN) 200 MG tablet Take 200 mg by mouth every 6 (six) hours as needed.     norethindrone-ethinyl estradiol-FE (JUNEL FE 1/20) 1-20 MG-MCG tablet Take 1 tablet by mouth in the morning and at bedtime. 28 tablet 1   oxyCODONE (OXY IR/ROXICODONE) 5 MG immediate release tablet Take 1 tablet (5 mg total) by mouth every 6 (six) hours as needed for severe pain. 15 tablet 0   traMADol-acetaminophen (ULTRACET) 37.5-325 MG tablet Take 1 tablet by mouth every 6 (six) hours as needed. 30 tablet 0   No current facility-administered medications for this visit.    PHYSICAL EXAMINATION: ECOG PERFORMANCE STATUS: 1 - Symptomatic but completely ambulatory  Vitals:   07/27/22 1105  BP: (!) 150/84  Pulse: 72  Resp: 17  Temp: 98.2 F (36.8 C)  SpO2: 98%   Filed Weights   07/27/22 1105  Weight: 133 lb  1 oz (60.4 kg)      LABORATORY DATA:  I have reviewed the data as listed    Latest Ref Rng & Units 06/26/2022    8:24 AM 06/25/2022    5:32 AM 06/24/2022    5:10 AM  CMP  Glucose 70 - 99 mg/dL 956  99  97   BUN 6 - 20  mg/dL <5  6  <5   Creatinine 0.44 - 1.00 mg/dL 2.13  0.86  5.78   Sodium 135 - 145 mmol/L 135  137  137   Potassium 3.5 - 5.1 mmol/L 4.1  4.5  4.4   Chloride 98 - 111 mmol/L 96  100  100   CO2 22 - 32 mmol/L 27  28  26    Calcium 8.9 - 10.3 mg/dL 9.2  8.9  9.0   Total Protein 6.5 - 8.1 g/dL 6.6  6.0  5.7   Total Bilirubin 0.3 - 1.2 mg/dL 0.3  0.3  0.2   Alkaline Phos 38 - 126 U/L 407  348  369   AST 15 - 41 U/L 74  118  96   ALT 0 - 44 U/L 145  169  162     Lab Results  Component Value Date   WBC 12.2 (H) 06/26/2022   HGB 11.6 (L) 06/26/2022   HCT 34.3 (L) 06/26/2022   MCV 87.7 06/26/2022   PLT 730 (H) 06/26/2022   NEUTROABS 8.8 (H) 06/26/2022    ASSESSMENT & PLAN:  Malignant neoplasm of upper-outer quadrant of right breast in female, estrogen receptor positive (HCC) 05/17/22: Screening mammogram detected right breast asymmetry 12 o'clock position anteriorly measuring 0.7 cm, posteriorly there was enhancement (stereotactic biopsy: Benign) biopsy of the anterior abnormality revealed grade 1 IDC with low-grade DCIS and calcifications ER 80%, PR 100%, HER2 equivocal by IHC, FISH negative, Ki-67 5%   Treatment plan: 1. Breast conserving surgery 07/17/2022: Grade 1 IDC 0.9 cm with DCIS, margins negative, ER 80%, PR 100%, HER2 2+ by IHC, FISH negative, Ki-67 5% 0/4 lymph nodes negative 2. did not recommend Oncotype testing since the final tumor size was less than 1 cm and grade 1  3. Adjuvant radiation therapy followed by 4. Adjuvant antiestrogen therapy   Ovarian cyst: Hospitalized 06/22/2022-06/26/2022: Pelvic inflammatory disease treated with antibiotics Return to clinic after radiation is complete. Patient is not keen on taking tamoxifen but will discuss it after she completes radiation.  We discussed a lower dosage of tamoxifen at 10 mg may be an option for her.   No orders of the defined types were placed in this encounter.  The patient has a good understanding of the overall  plan. she agrees with it. she will call with any problems that may develop before the next visit here. Total time spent: 30 mins including face to face time and time spent for planning, charting and co-ordination of care   Tamsen Meek, MD 07/27/22    I Janan Ridge am acting as a Neurosurgeon for [employer]  I have reviewed the above documentation for accuracy and completeness, and I agree with the above.

## 2022-07-25 LAB — SURGICAL PATHOLOGY

## 2022-07-26 ENCOUNTER — Encounter: Payer: Self-pay | Admitting: Surgery

## 2022-07-26 ENCOUNTER — Encounter: Payer: Self-pay | Admitting: *Deleted

## 2022-07-27 ENCOUNTER — Other Ambulatory Visit: Payer: Self-pay | Admitting: *Deleted

## 2022-07-27 ENCOUNTER — Inpatient Hospital Stay: Payer: 59 | Attending: Hematology and Oncology | Admitting: Hematology and Oncology

## 2022-07-27 VITALS — BP 150/84 | HR 72 | Temp 98.2°F | Resp 17 | Ht 61.75 in | Wt 133.1 lb

## 2022-07-27 DIAGNOSIS — Z170 Estrogen receptor positive status [ER+]: Secondary | ICD-10-CM | POA: Diagnosis present

## 2022-07-27 DIAGNOSIS — N83201 Unspecified ovarian cyst, right side: Secondary | ICD-10-CM | POA: Diagnosis not present

## 2022-07-27 DIAGNOSIS — N739 Female pelvic inflammatory disease, unspecified: Secondary | ICD-10-CM | POA: Insufficient documentation

## 2022-07-27 DIAGNOSIS — C50411 Malignant neoplasm of upper-outer quadrant of right female breast: Secondary | ICD-10-CM | POA: Diagnosis not present

## 2022-07-27 NOTE — Assessment & Plan Note (Signed)
05/17/22: Screening mammogram detected right breast asymmetry 12 o'clock position anteriorly measuring 0.7 cm, posteriorly there was enhancement (stereotactic biopsy: Benign) biopsy of the anterior abnormality revealed grade 1 IDC with low-grade DCIS and calcifications ER 80%, PR 100%, HER2 equivocal by IHC, FISH negative, Ki-67 5%   Treatment plan: 1. Breast conserving surgery 07/17/2022: Grade 1 IDC 0.9 cm with DCIS, margins negative, ER 80%, PR 100%, HER2 2+ by IHC, FISH negative, Ki-67 5% 0/4 lymph nodes negative 2. did not recommend Oncotype testing since the final tumor size was less than 1 cm and grade 1  3. Adjuvant radiation therapy followed by 4. Adjuvant antiestrogen therapy   Ovarian cyst: Hospitalized 06/22/2022-06/26/2022: Pelvic inflammatory disease treated with antibiotics Return to clinic after radiation is complete.

## 2022-08-01 ENCOUNTER — Encounter: Payer: Self-pay | Admitting: *Deleted

## 2022-08-05 NOTE — Progress Notes (Signed)
Radiation Oncology         ([phone]) [phone] ________________________________  Name: Andrea Johns        MRN: [id number]  Date of Service: 08/07/2022 DOB: Jan 03, 1971  CC:Mila Palmer, MD  Serena Croissant, MD     REFERRING PHYSICIAN: Serena Croissant, MD   DIAGNOSIS: There were no encounter diagnoses.   HISTORY OF PRESENT ILLNESS: Andrea Johns is a 52 y.o. female seen in the multidisciplinary breast clinic for a new diagnosis of right breast cancer. The patient had screening detected right breast asymmetry and subsequently returned for diagnostic imaging on 05/07/2022.  By spot compression and additional mammography, there was concern for persistent irregular asymmetry with associated amorphous calcifications.  By ultrasound in the 12 o'clock position there was a 7 mm mass 3 cm from the nipple corresponding with the asymmetry and no additional asymmetry in the posterior medial breast was noted.  Her axilla was negative for adenopathy.  She underwent ultrasound-guided biopsy of the lesion at 12:00 and stereotactic biopsy of the asymmetry mammographically on 05/17/2022.  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  The medial breast specimen showed benign breast tissue with fibrocystic change and mildly increased vascularity.  Her prognostic panel showed that the tumor was ER/PR positive, HER2 negative, Ki-67 5%.   Since her last visit she underwent a right lumpectomy with sentinel node biopsy on 07/17/22 that showed a grade 1 invasive ductal carcinoma measuring 9 mm with associated low grade DCIS with calcifications. Margins were negative, and 4 sampled nodes were also negative. Given the size and grade, Oncotype Dx scoring was not recommended. She's seen today to discuss adjuvant radiotherapy to the right breast.     PREVIOUS RADIATION THERAPY: No   PAST MEDICAL HISTORY:  Past Medical History:  Diagnosis Date   Anemia        PAST  SURGICAL HISTORY: Past Surgical History:  Procedure Laterality Date   BREAST BIOPSY Right 05/17/2022   [place] RT BREAST BX W LOC DEV 1ST LESION IMG BX SPEC US GUIDE 05/17/2022 [facility]   BREAST BIOPSY Right 05/17/2022   MM RT BREAST BX W LOC DEV 1ST LESION IMAGE BX SPEC STEREO GUIDE 05/17/2022 [facility]   BREAST BIOPSY  07/16/2022   MM RT RADIOACTIVE SEED LOC MAMMO GUIDE 07/16/2022 [facility]   BREAST LUMPECTOMY WITH RADIOACTIVE SEED AND SENTINEL LYMPH NODE BIOPSY Right 07/17/2022   Procedure: RIGHT BREAST LUMPECTOMY WITH RADIOACTIVE SEED AND SENTINEL LYMPH NODE BIOPSY;  Surgeon: Harriette Bouillon, MD;  Location: [facility];  Service: General;  Laterality: Right;   EYE SURGERY     HAND SURGERY     TUBAL LIGATION     and reversal     FAMILY HISTORY:  Family History  Problem Relation Age of Onset   Breast cancer Maternal Aunt 68   Lung cancer Paternal Uncle 60       smoked   Anesthesia problems Neg Hx      SOCIAL HISTORY:  reports that she has never smoked. She has never used smokeless tobacco. She reports current alcohol use of about 1.0 standard drink of alcohol per week. She reports that she does not use drugs.  The patient is married and lives in [place].  She currently helps with home schooling her 41 year old granddaughter.***   ALLERGIES: Sulfamethoxazole-trimethoprim, Ciprofloxacin, Codeine, and Latex   MEDICATIONS:  Current Outpatient Medications  Medication Sig Dispense Refill   ibuprofen (  ADVIL) 800 MG tablet Take 1 tablet (800 mg total) by mouth every 8 (eight) hours as needed. 30 tablet 0   ibuprofen (ADVIL,MOTRIN) 200 MG tablet Take 200 mg by mouth every 6 (six) hours as needed.     norethindrone-ethinyl estradiol-FE (JUNEL FE 1/20) 1-20 MG-MCG tablet Take 1 tablet by mouth in the morning and at bedtime. 28 tablet 1   oxyCODONE (OXY IR/ROXICODONE) 5 MG immediate release tablet Take 1 tablet (5 mg total) by mouth every 6 (six) hours as  needed for severe pain. 15 tablet 0   traMADol-acetaminophen (ULTRACET) 37.5-325 MG tablet Take 1 tablet by mouth every 6 (six) hours as needed. 30 tablet 0   No current facility-administered medications for this visit.     REVIEW OF SYSTEMS: On review of systems, the patient reports that she is doing well. She reports no specific breast complaints.      PHYSICAL EXAM:  Wt Readings from Last 3 Encounters:  07/27/22 133 lb 1 oz (60.4 kg)  07/17/22 133 lb 6.1 oz (60.5 kg)  06/23/22 134 lb 7.7 oz (61 kg)   Temp Readings from Last 3 Encounters:  07/27/22 98.2 F (36.8 C) (Temporal)  07/17/22 98.4 F (36.9 C)  06/26/22 97.6 F (36.4 C) (Oral)   BP Readings from Last 3 Encounters:  07/27/22 (!) 150/84  07/17/22 (!) 152/68  06/26/22 116/68   Pulse Readings from Last 3 Encounters:  07/27/22 72  07/17/22 80  06/26/22 71    In general this is a well appearing female in no acute distress. She's alert and oriented x4 and appropriate throughout the examination. Cardiopulmonary assessment is negative for acute distress and she exhibits normal effort. Bilateral breast exam is deferred.    ECOG = 0  0 - Asymptomatic (Fully active, able to carry on all predisease activities without restriction)  1 - Symptomatic but completely ambulatory (Restricted in physically strenuous activity but ambulatory and able to carry out work of a light or sedentary nature. For example, light housework, office work)  2 - Symptomatic, <50% in bed during the day (Ambulatory and capable of all self care but unable to carry out any work activities. Up and about more than 50% of waking hours)  3 - Symptomatic, >50% in bed, but not bedbound (Capable of only limited self-care, confined to bed or chair 50% or more of waking hours)  4 - Bedbound (Completely disabled. Cannot carry on any self-care. Totally confined to bed or chair)  5 - Death   Santiago Glad MM, Creech RH, Tormey DC, et al. [phone]). "Toxicity and  response criteria of the [facility]". Am. Evlyn Clines. Oncol. 5 (6): 649-55    LABORATORY DATA:  Lab Results  Component Value Date   WBC 12.2 (H) 06/26/2022   HGB 11.6 (L) 06/26/2022   HCT 34.3 (L) 06/26/2022   MCV 87.7 06/26/2022   PLT 730 (H) 06/26/2022   Lab Results  Component Value Date   NA 135 06/26/2022   K 4.1 06/26/2022   CL 96 (L) 06/26/2022   CO2 27 06/26/2022   Lab Results  Component Value Date   ALT 145 (H) 06/26/2022   AST 74 (H) 06/26/2022   ALKPHOS 407 (H) 06/26/2022   BILITOT 0.3 06/26/2022      RADIOGRAPHY: MM Breast Surgical Specimen  Result Date: 07/17/2022 CLINICAL DATA:  Post lumpectomy specimen radiograph EXAM: SPECIMEN RADIOGRAPH OF THE RIGHT BREAST COMPARISON:  Previous exam(s). FINDINGS: Status post excision of the right breast. The  radioactive seed and biopsy marker clip are present and completely intact. IMPRESSION: Specimen radiograph of the right breast. Electronically Signed   By: Emmaline Kluver M.D.   On: 07/17/2022 15:21  MM RT RADIOACTIVE SEED LOC MAMMO GUIDE  Result Date: 07/16/2022 CLINICAL DATA:  52 year old female presenting for radioactive seed localization of the right breast prior to lumpectomy. EXAM: MAMMOGRAPHIC GUIDED RADIOACTIVE SEED LOCALIZATION OF THE RIGHT BREAST COMPARISON:  Previous exam(s). FINDINGS: Patient presents for radioactive seed localization prior to right breast lumpectomy. I met with the patient and we discussed the procedure of seed localization including benefits and alternatives. We discussed the high likelihood of a successful procedure. We discussed the risks of the procedure including infection, bleeding, tissue injury and further surgery. We discussed the low dose of radioactivity involved in the procedure. Informed, written consent was given. The usual time-out protocol was performed immediately prior to the procedure. Using mammographic guidance, sterile technique, 1% lidocaine and an  I-125 radioactive seed, the ribbon shaped biopsy marking clip in the superior right breast was localized using a superior approach. The follow-up mammogram images confirm the seed in the expected location and were marked for Dr. Luisa Hart. Follow-up survey of the patient confirms presence of the radioactive seed. Order number of I-125 seed:  [id number]. Total activity:  0.260 millicuries reference Date: 07/04/2022 The patient tolerated the procedure well and was released from the [facility]. She was given instructions regarding seed removal. IMPRESSION: Radioactive seed localization right breast. No apparent complications. Electronically Signed   By: Frederico Hamman M.D.   On: 07/16/2022 14:23      IMPRESSION/PLAN: 1. Stage IA, pT1bN0M0, Grade 1, ER/PR positive invasive ductal carcinoma of the right breast. Dr. Mitzi Hansen hs reviewed her final pathology findings and we reviewed the nature of early stage right breast disease. She has done well since surgery and based on her small tumor size, Oncotype Dx testing and chemotherapy are not recommended. Dr. Mitzi Hansen would recommend external radiotherapy to the breast  to reduce risks of local recurrence followed by antiestrogen therapy. We discussed the risks, benefits, short, and long term effects of radiotherapy, as well as the curative intent, and the patient is interested in proceeding. Dr. Mitzi Hansen discusses the delivery and logistics of radiotherapy and anticipates a course of 4 weeks of radiotherapy to the right breast. Written consent is obtained and placed in the chart, a copy was provided to the patient. She will simulate today***   In a visit lasting *** minutes, greater than 50% of the time was spent face to face discussing the patient's condition, in preparation for the discussion, and coordinating the patient's care.     Osker Mason, [facility]      **Disclaimer: This note was dictated with voice recognition software. Similar sounding words can  inadvertently be transcribed and this note may contain transcription errors which may not have been corrected upon publication of note.**

## 2022-08-07 ENCOUNTER — Ambulatory Visit: Payer: 59 | Attending: Surgery

## 2022-08-07 ENCOUNTER — Ambulatory Visit: Payer: 59 | Admitting: Radiation Oncology

## 2022-08-07 ENCOUNTER — Other Ambulatory Visit: Payer: Self-pay

## 2022-08-07 ENCOUNTER — Ambulatory Visit
Admission: RE | Admit: 2022-08-07 | Discharge: 2022-08-07 | Disposition: A | Discharge status: Home / Self Care | Payer: 59 | Source: Ambulatory Visit | Attending: Radiation Oncology | Admitting: Radiation Oncology

## 2022-08-07 VITALS — BP 142/92 | HR 70 | Temp 97.5°F | Resp 18 | Ht 61.75 in | Wt 134.8 lb

## 2022-08-07 DIAGNOSIS — Z79899 Other long term (current) drug therapy: Secondary | ICD-10-CM | POA: Diagnosis not present

## 2022-08-07 DIAGNOSIS — C50811 Malignant neoplasm of overlapping sites of right female breast: Secondary | ICD-10-CM | POA: Diagnosis present

## 2022-08-07 DIAGNOSIS — Z170 Estrogen receptor positive status [ER+]: Secondary | ICD-10-CM | POA: Diagnosis present

## 2022-08-07 DIAGNOSIS — M25611 Stiffness of right shoulder, not elsewhere classified: Secondary | ICD-10-CM | POA: Diagnosis present

## 2022-08-07 DIAGNOSIS — I10 Essential (primary) hypertension: Secondary | ICD-10-CM | POA: Insufficient documentation

## 2022-08-07 DIAGNOSIS — Z801 Family history of malignant neoplasm of trachea, bronchus and lung: Secondary | ICD-10-CM | POA: Diagnosis not present

## 2022-08-07 DIAGNOSIS — C50411 Malignant neoplasm of upper-outer quadrant of right female breast: Secondary | ICD-10-CM | POA: Diagnosis present

## 2022-08-07 DIAGNOSIS — R293 Abnormal posture: Secondary | ICD-10-CM | POA: Insufficient documentation

## 2022-08-07 DIAGNOSIS — Z803 Family history of malignant neoplasm of breast: Secondary | ICD-10-CM | POA: Insufficient documentation

## 2022-08-07 NOTE — Progress Notes (Addendum)
Nursing interview for Malignant neoplasm of upper-outer quadrant of right breast in female, estrogen receptor positive (HCC).  Patient identity verified x2.   Patient reports RT breast, axilla and arm pain 6/10, w/ a seroma. No other issues reported at this time.  Meaningful use complete. LMP x2 wks ago. Patient states "NO chances of pregnancy."  BP (!) 154/99 (BP Location: Left Arm, Patient Position: Sitting, Cuff Size: Normal)   Pulse 71   Temp (!) 97.5 F (36.4 C) (Temporal)   Resp 18   Ht 5' 1.75" (1.568 m)   Wt 134 lb 12.8 oz (61.1 kg)   SpO2 100%   BMI 24.86 kg/m   This concludes the interview.   Ruel Favors, LPN

## 2022-08-07 NOTE — Therapy (Addendum)
OUTPATIENT PHYSICAL THERAPY BREAST CANCER POST OP FOLLOW UP   Patient Name: Andrea Johns MRN: [id number] DOB:01-11-1971, 52 y.o., female Today's Date: 08/07/2022  END OF SESSION:  PT End of Session - 08/07/22 1600     Visit Number 2    Number of Visits 10    Date for PT Re-Evaluation 09/04/22    PT Start Time 1602    PT Stop Time 1648    PT Time Calculation (min) 46 min    Activity Tolerance Patient tolerated treatment well    Behavior During Therapy [facility] for tasks assessed/performed             Past Medical History:  Diagnosis Date   Anemia    Past Surgical History:  Procedure Laterality Date   BREAST BIOPSY Right 05/17/2022   [place] RT BREAST BX W LOC DEV 1ST LESION IMG BX SPEC US GUIDE 05/17/2022 [facility]   BREAST BIOPSY Right 05/17/2022   MM RT BREAST BX W LOC DEV 1ST LESION IMAGE BX SPEC STEREO GUIDE 05/17/2022 [facility]   BREAST BIOPSY  07/16/2022   MM RT RADIOACTIVE SEED LOC MAMMO GUIDE 07/16/2022 [facility]   BREAST LUMPECTOMY WITH RADIOACTIVE SEED AND SENTINEL LYMPH NODE BIOPSY Right 07/17/2022   Procedure: RIGHT BREAST LUMPECTOMY WITH RADIOACTIVE SEED AND SENTINEL LYMPH NODE BIOPSY;  Surgeon: Harriette Bouillon, MD;  Location: [facility];  Service: General;  Laterality: Right;   EYE SURGERY     HAND SURGERY     TUBAL LIGATION     and reversal   Patient Active Problem List   Diagnosis Date Noted   Pelvic inflammatory disease 06/22/2022   Genetic testing 05/31/2022   Malignant neoplasm of upper-outer quadrant of right breast in female, estrogen receptor positive (HCC) 05/21/2022   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   Chronic pain of both knees 11/06/2016    PCP: Mila Palmer, MD  REFERRING PROVIDER: Harriette Bouillon, MD  REFERRING DIAG: Right Breast Cancer  THERAPY DIAG:  Malignant neoplasm of upper-outer quadrant of right breast in female, estrogen receptor positive (HCC)  Abnormal posture  Rationale for  Evaluation and Treatment: Rehabilitation  ONSET DATE: 04/19/22  SUBJECTIVE:                                                                                                                                                                                           SUBJECTIVE STATEMENT: The surgery went well and everything came back great. The compression bra cuts into my incision area.  I have been doing the exercises and doing well. It is pulling from my wrist to my elbow  and that is really painful.  The back and inside of my arm is very sensitive. The swollen area  looks more red today. The PA encouraged me to use heat on, but I think it should have been drained.  PERTINENT HISTORY:  Patient was diagnosed on 04/19/2022 with right grade 1 invasive ductal carcinoma breast cancer with DCIS. It measures 7 mm and is located in the upper outer quadrant. It is ER/PR positive and HER2 negative with a Ki67 of 5%.  She had a right lumpectomy with SLNB on 07/17/2022. She is pending radiation and Tamoxifen. She has some right axillary fullness with concern of lymphocele and they are holding radiation until she sees the MD next week.  PATIENT GOALS:  Reassess how my recovery is going related to arm function, pain, and swelling.  PAIN:  Are you having pain? No and Yes: NPRS scale: 2/10 Pain location: posterior arm, axilla, forearm Pain description: achy all up arm Aggravating factors: stretching/reach, touching back of arm, a lot of use Relieving factors: nothing  PRECAUTIONS: Recent Surgery, right UE Lymphedema risk,   ACTIVITY LEVEL / LEISURE: walking 30 min,   OBJECTIVE:   PATIENT SURVEYS:  QUICK DASH: 34%  OBSERVATIONS: Area of mild redness around incision and lateral breast with defined borders that feels slightly warmer than the other side. Photos taken and placed in Media. MD also messaged and asked to contact pt. Pt will follow up with MD as well by phone tomorrow. Large cord in right axilla  and running down below elbow making arm very sensitive to light touch.  POSTURE:  Forward head, rounded shoulders  LYMPHEDEMA ASSESSMENT:   UPPER EXTREMITY AROM/PROM:   A/PROM RIGHT   eval   RIGHT 08/07/2022  Shoulder extension 58 57  Shoulder flexion 165 164  Shoulder abduction 172 116  Shoulder internal rotation 71 60  Shoulder external rotation 85 105                          (Blank rows = not tested)   A/PROM LEFT   eval  Shoulder extension 53  Shoulder flexion 148  Shoulder abduction 170  Shoulder internal rotation 56  Shoulder external rotation 89                          (Blank rows = not tested)   CERVICAL AROM: All within normal limits   UPPER EXTREMITY STRENGTH: WFL   LYMPHEDEMA ASSESSMENTS:    LANDMARK RIGHT   eval RIGHT 08/07/2022  10 cm proximal to olecranon process 28 27.5  Olecranon process 23.3 23.0  10 cm proximal to ulnar styloid process 20 20.1  Just proximal to ulnar styloid process 14.8 14.8  Across hand at thumb web space 17.5 18.2  At base of 2nd digit 5.7 5.7  (Blank rows = not tested)   LANDMARK LEFT   eval  10 cm proximal to olecranon process 27.7  Olecranon process 23.3  10 cm proximal to ulnar styloid process 19.8  Just proximal to ulnar styloid process 14.7  Across hand at thumb web space 17.3  At base of 2nd digit 5.6  (Blank rows = not tested)    Surgery type/Date: 07/17/2022 Right Lumpectomy with SLNB Number of lymph nodes removed: 0/4 Current/past treatment (chemo, radiation, hormone therapy): Pending radiation /tamoxifen Other symptoms:  Heaviness/tightness Yes Pain Yes Pitting edema No Infections No Decreased scar mobility Yes Stemmer sign No  PATIENT  EDUCATION:  Education details: reviewed post op exs and instructed in NTS for cording, SOZO screen set up, scar massage to healed incisions avoiding axilla presently, foam pad made for axillary border of bra to increase comfort and gave pt additional foam and TG soft if  she requires more, ABC class set up, gave bone pillow for under right arm to add comfort/compression Person educated: Patient and Spouse Education method: Explanation, Demonstration, and Handouts Education comprehension: verbalized understanding and returned demonstration  HOME EXERCISE PROGRAM: Reviewed previously given post op HEP x 5 ea Instructed in scar massage when incisions are fully healed  ASSESSMENT:  CLINICAL IMPRESSION: Pt is s/p Right Lumpectomy with SLNB (0/4LN) on 07/17/2022. She has an area in region of axillary incision with defined borders and slight redness with significant tenderness consistent with a seroma or similar. Photos were placed in media and MD messaged. Foam pad was made for axillary region of bra which she did not have on today. Glue present on both incisions which are healed. A large cord is noted in axilla which runs down below her elbow and is very sensitive. She is doing well with AROM except for abduction which is limited by cording. She will benefit from skilled PT to address deficits and return to PLOF.  Pt will benefit from skilled therapeutic intervention to improve on the following deficits: Decreased knowledge of precautions, impaired UE functional use, pain, decreased ROM, postural dysfunction.   PT treatment/interventions: ADL/Self care home management, Therapeutic exercises, Therapeutic activity, Patient/Family education, Self Care, Manual lymph drainage, scar mobilization, Manual therapy, and Re-evaluation   GOALS: Goals reviewed with patient? Yes  LONG TERM GOALS:  (STG=LTG)  GOALS Name Target Date  Goal status  1 Pt will demonstrate she has regained full shoulder ROM and function post operatively compared to baselines.  Baseline: 09/04/2022 IN PROGRESS  2 Pt will have no discomfort/pain from cording or limitations in motion 09/04/22 INITIAL  3 Quick dash will be no greater than 13% to demonstrate improved function 09/04/22 INITIAL  4         PLAN:  PT FREQUENCY/DURATION: 2x/week x 4 weeks  PLAN FOR NEXT SESSION: Did foam in bra/pillow help? See MD?,supine wand scaption, cording release, PROM, assess area with defined borders at axillary incision , update HEP prn, progress to strength when ready PHYSICAL THERAPY DISCHARGE SUMMARY  Visits from Start of Care: 2  Current functional level related to goals / functional outcomes: Unable to be assessed;pt no showed/cancelled multiple visits   Remaining deficits: Unknown   Education / Equipment: HEP   Patient agrees to discharge. Patient goals were not met. Patient is being discharged due to not returning since the last visit.Multiple NO-shows and cancellations despite phone calls to pt.   [facility]  [address], [address]  [place] [address]  [phone]  After Breast Cancer Class It is recommended you attend the ABC class to be educated on lymphedema risk reduction. This class is free of charge and lasts for 1 hour. It is a 1-time class. You will need to download the TEAMS app either on your phone or computer. We will send you a link the night before or the morning of the class. You should be able to click on that link to join the class. This is not a confidential class. You don't have to turn your camera on, but other participants may be able to see your email address.  Scar massage You can begin gentle scar massage  to you incision sites. Gently place one hand on the incision and move the skin (without sliding on the skin) in various directions. Do this for a few minutes and then you can gently massage either coconut oil or vitamin E cream into the scars.  Compression garment You should continue wearing your compression bra until you feel like you no longer have swelling.  Home exercise Program Continue doing the exercises you were given until you feel like you can do them without feeling any tightness at the end.   Walking  Program Studies show that 30 minutes of walking per day (fast enough to elevate your heart rate) can significantly reduce the risk of a cancer recurrence. If you can't walk due to other medical reasons, we encourage you to find another activity you could do (like a stationary bike or water exercise).  Posture After breast cancer surgery, people frequently sit with rounded shoulders posture because it puts their incisions on slack and feels better. If you sit like this and scar tissue forms in that position, you can become very tight and have pain sitting or standing with good posture. Try to be aware of your posture and sit and stand up tall to heal properly.  Follow up PT: It is recommended you return every 3 months for the first 3 years following surgery to be assessed on the SOZO machine for an L-Dex score. This helps prevent clinically significant lymphedema in 95% of patients. These follow up screens are 10 minute appointments that you are not billed for.  Waynette Buttery, PT 08/07/2022, 4:50 PM

## 2022-08-07 NOTE — Patient Instructions (Signed)
     [facility]  [address]  [place] [address]  [phone]  After Breast Cancer Class It is recommended you attend the ABC class to be educated on lymphedema risk reduction. This class is free of charge and lasts for 1 hour. It is a 1-time class. You will need to download the TEAMs app either on your phone or computer. We will send you a link the night before or the morning of the class. You should be able to click on that link to join the class. This is not a confidential class. You don't have to turn your camera on, but other participants may be able to see your email address.  Scar massage You can begin gentle scar massage to you incision sites. Gently place one hand on the incision and move the skin (without sliding on the skin) in various directions. Do this for a few minutes and then you can gently massage either coconut oil or vitamin E cream into the scars.  Compression garment You should continue wearing your compression bra until you feel like you no longer have swelling.  Home exercise Program Continue doing the exercises you were given until you feel like you can do them without feeling any tightness at the end.   Walking Program Studies show that 30 minutes of walking per day (fast enough to elevate your heart rate) can significantly reduce the risk of a cancer recurrence. If you can't walk due to other medical reasons, we encourage you to find another activity you could do (like a stationary bike or water exercise).  Posture After breast cancer surgery, people frequently sit with rounded shoulders posture because it puts their incisions on slack and feels better. If you sit like this and scar tissue forms in that position, you can become very tight and have pain sitting or standing with good posture. Try to be aware of your posture and sit and stand up tall to heal properly.  Follow up PT: It is recommended you return every 3 months for  the first 2 years following surgery to be assessed on the SOZO machine for an L-Dex score. This helps prevent clinically significant lymphedema in 95% of patients. These follow up screens are 10 minute appointments that you are not billed for.

## 2022-08-08 ENCOUNTER — Inpatient Hospital Stay: Payer: 59 | Attending: Hematology and Oncology | Admitting: Licensed Clinical Social Worker

## 2022-08-08 NOTE — Progress Notes (Signed)
[facility] Psychosocial Distress Screening Clinical Social Work  Clinical Social Work was referred by distress screening protocol.  The patient scored a 6 on the Psychosocial Distress Thermometer which indicates moderate distress. Clinical Social Worker contacted patient by phone to assess for distress and other psychosocial needs.     Distress Screen:    08/07/2022    9:27 AM  ONCBCN DISTRESS SCREENING  Distress experienced in past week (1-10) 6  Emotional problem type Nervousness/Anxiety;Adjusting to illness    CSW and patient discussed common feeling and emotions when being diagnosed with cancer, and the importance of support during treatment.  Patient is adjusting to limitations on how active she can be currently, but is optimistic that she will return to full ability in the future. She reports strong support from family and friends, including a friend who has been through breast cancer herself. CSW informed patient of the support team and support services at [facility].  CSW provided contact information and encouraged patient to call with any questions or concerns.  Clinical Social Worker follow up needed: No.    Andrea Johns E Andrea Azam, LCSW

## 2022-08-09 ENCOUNTER — Ambulatory Visit: Payer: 59 | Admitting: Physical Therapy

## 2022-08-09 ENCOUNTER — Encounter: Payer: Self-pay | Admitting: *Deleted

## 2022-08-16 ENCOUNTER — Ambulatory Visit: Payer: 59

## 2022-08-16 ENCOUNTER — Ambulatory Visit: Payer: 59 | Admitting: Radiation Oncology

## 2022-08-23 ENCOUNTER — Encounter: Payer: Self-pay | Admitting: *Deleted

## 2022-08-23 ENCOUNTER — Other Ambulatory Visit: Payer: Self-pay

## 2022-08-23 ENCOUNTER — Ambulatory Visit
Admission: RE | Admit: 2022-08-23 | Discharge: 2022-08-23 | Disposition: A | Discharge status: Home / Self Care | Payer: 59 | Source: Ambulatory Visit | Attending: Radiation Oncology | Admitting: Radiation Oncology

## 2022-08-23 DIAGNOSIS — Z170 Estrogen receptor positive status [ER+]: Secondary | ICD-10-CM

## 2022-08-23 DIAGNOSIS — C50411 Malignant neoplasm of upper-outer quadrant of right female breast: Secondary | ICD-10-CM | POA: Insufficient documentation

## 2022-08-24 ENCOUNTER — Telehealth: Payer: Self-pay | Admitting: Hematology and Oncology

## 2022-08-24 NOTE — Telephone Encounter (Signed)
Patient is aware of upcoming appointment dates/times.  

## 2022-08-28 ENCOUNTER — Ambulatory Visit: Payer: 59

## 2022-08-29 DIAGNOSIS — C50411 Malignant neoplasm of upper-outer quadrant of right female breast: Secondary | ICD-10-CM | POA: Diagnosis not present

## 2022-08-30 ENCOUNTER — Ambulatory Visit: Payer: 59

## 2022-09-03 ENCOUNTER — Ambulatory Visit
Admission: RE | Admit: 2022-09-03 | Discharge: 2022-09-03 | Disposition: A | Discharge status: Home / Self Care | Payer: 59 | Source: Ambulatory Visit | Attending: Radiation Oncology | Admitting: Radiation Oncology

## 2022-09-03 ENCOUNTER — Other Ambulatory Visit: Payer: Self-pay

## 2022-09-03 DIAGNOSIS — Z170 Estrogen receptor positive status [ER+]: Secondary | ICD-10-CM | POA: Insufficient documentation

## 2022-09-03 DIAGNOSIS — C50411 Malignant neoplasm of upper-outer quadrant of right female breast: Secondary | ICD-10-CM | POA: Diagnosis present

## 2022-09-03 LAB — RAD ONC ARIA SESSION SUMMARY
Course Elapsed Days: 0
Plan Fractions Treated to Date: 1
Plan Prescribed Dose Per Fraction: 2.66 Gy
Plan Total Fractions Prescribed: 16
Plan Total Prescribed Dose: 42.56 Gy
Reference Point Dosage Given to Date: 2.66 Gy
Reference Point Session Dosage Given: 2.66 Gy
Session Number: 1

## 2022-09-04 ENCOUNTER — Other Ambulatory Visit: Payer: Self-pay

## 2022-09-04 ENCOUNTER — Ambulatory Visit
Admission: RE | Admit: 2022-09-04 | Discharge: 2022-09-04 | Disposition: A | Discharge status: Home / Self Care | Payer: 59 | Source: Ambulatory Visit | Attending: Radiation Oncology | Admitting: Radiation Oncology

## 2022-09-04 DIAGNOSIS — C50411 Malignant neoplasm of upper-outer quadrant of right female breast: Secondary | ICD-10-CM | POA: Diagnosis not present

## 2022-09-04 LAB — RAD ONC ARIA SESSION SUMMARY
Course Elapsed Days: 1
Plan Fractions Treated to Date: 2
Plan Prescribed Dose Per Fraction: 2.66 Gy
Plan Total Fractions Prescribed: 16
Plan Total Prescribed Dose: 42.56 Gy
Reference Point Dosage Given to Date: 5.32 Gy
Reference Point Session Dosage Given: 2.66 Gy
Session Number: 2

## 2022-09-05 ENCOUNTER — Other Ambulatory Visit: Payer: Self-pay

## 2022-09-05 ENCOUNTER — Ambulatory Visit: Payer: 59

## 2022-09-05 ENCOUNTER — Ambulatory Visit
Admission: RE | Admit: 2022-09-05 | Discharge: 2022-09-05 | Disposition: A | Discharge status: Home / Self Care | Payer: 59 | Source: Ambulatory Visit | Attending: Radiation Oncology | Admitting: Radiation Oncology

## 2022-09-05 DIAGNOSIS — C50411 Malignant neoplasm of upper-outer quadrant of right female breast: Secondary | ICD-10-CM

## 2022-09-05 LAB — RAD ONC ARIA SESSION SUMMARY
Course Elapsed Days: 2
Plan Fractions Treated to Date: 3
Plan Prescribed Dose Per Fraction: 2.66 Gy
Plan Total Fractions Prescribed: 16
Plan Total Prescribed Dose: 42.56 Gy
Reference Point Dosage Given to Date: 7.98 Gy
Reference Point Session Dosage Given: 2.66 Gy
Session Number: 3

## 2022-09-05 MED ORDER — RADIAPLEXRX EX GEL: Freq: Once | CUTANEOUS | Status: AC

## 2022-09-05 MED ORDER — ALRA NON-METALLIC DEODORANT (RAD-ONC)
1.0000 | Freq: Once | TOPICAL | Status: AC
  Administered 2022-09-05: 1 via TOPICAL

## 2022-09-07 ENCOUNTER — Other Ambulatory Visit: Payer: Self-pay

## 2022-09-07 ENCOUNTER — Ambulatory Visit: Admission: RE | Admit: 2022-09-07 | Payer: 59 | Source: Ambulatory Visit

## 2022-09-07 DIAGNOSIS — C50411 Malignant neoplasm of upper-outer quadrant of right female breast: Secondary | ICD-10-CM | POA: Diagnosis not present

## 2022-09-07 LAB — RAD ONC ARIA SESSION SUMMARY
Course Elapsed Days: 4
Plan Fractions Treated to Date: 4
Plan Prescribed Dose Per Fraction: 2.66 Gy
Plan Total Fractions Prescribed: 16
Plan Total Prescribed Dose: 42.56 Gy
Reference Point Dosage Given to Date: 10.64 Gy
Reference Point Session Dosage Given: 2.66 Gy
Session Number: 4

## 2022-09-10 ENCOUNTER — Other Ambulatory Visit: Payer: Self-pay

## 2022-09-10 ENCOUNTER — Ambulatory Visit
Admission: RE | Admit: 2022-09-10 | Discharge: 2022-09-10 | Disposition: A | Discharge status: Home / Self Care | Payer: 59 | Source: Ambulatory Visit | Attending: Radiation Oncology | Admitting: Radiation Oncology

## 2022-09-10 ENCOUNTER — Ambulatory Visit: Payer: 59

## 2022-09-10 DIAGNOSIS — C50411 Malignant neoplasm of upper-outer quadrant of right female breast: Secondary | ICD-10-CM | POA: Diagnosis not present

## 2022-09-10 LAB — RAD ONC ARIA SESSION SUMMARY
Course Elapsed Days: 7
Plan Fractions Treated to Date: 5
Plan Prescribed Dose Per Fraction: 2.66 Gy
Plan Total Fractions Prescribed: 16
Plan Total Prescribed Dose: 42.56 Gy
Reference Point Dosage Given to Date: 13.3 Gy
Reference Point Session Dosage Given: 2.66 Gy
Session Number: 5

## 2022-09-11 ENCOUNTER — Ambulatory Visit: Admission: RE | Admit: 2022-09-11 | Payer: 59 | Source: Ambulatory Visit

## 2022-09-11 ENCOUNTER — Other Ambulatory Visit: Payer: Self-pay

## 2022-09-11 DIAGNOSIS — C50411 Malignant neoplasm of upper-outer quadrant of right female breast: Secondary | ICD-10-CM | POA: Diagnosis not present

## 2022-09-11 LAB — RAD ONC ARIA SESSION SUMMARY
Course Elapsed Days: 8
Plan Fractions Treated to Date: 6
Plan Prescribed Dose Per Fraction: 2.66 Gy
Plan Total Fractions Prescribed: 16
Plan Total Prescribed Dose: 42.56 Gy
Reference Point Dosage Given to Date: 15.96 Gy
Reference Point Session Dosage Given: 2.66 Gy
Session Number: 6

## 2022-09-12 ENCOUNTER — Other Ambulatory Visit: Payer: Self-pay

## 2022-09-12 ENCOUNTER — Ambulatory Visit
Admission: RE | Admit: 2022-09-12 | Discharge: 2022-09-12 | Disposition: A | Discharge status: Home / Self Care | Payer: 59 | Source: Ambulatory Visit | Attending: Radiation Oncology | Admitting: Radiation Oncology

## 2022-09-12 DIAGNOSIS — C50411 Malignant neoplasm of upper-outer quadrant of right female breast: Secondary | ICD-10-CM | POA: Diagnosis not present

## 2022-09-12 LAB — RAD ONC ARIA SESSION SUMMARY
Course Elapsed Days: 9
Plan Fractions Treated to Date: 7
Plan Prescribed Dose Per Fraction: 2.66 Gy
Plan Total Fractions Prescribed: 16
Plan Total Prescribed Dose: 42.56 Gy
Reference Point Dosage Given to Date: 18.62 Gy
Reference Point Session Dosage Given: 2.66 Gy
Session Number: 7

## 2022-09-13 ENCOUNTER — Other Ambulatory Visit: Payer: Self-pay

## 2022-09-13 ENCOUNTER — Ambulatory Visit
Admission: RE | Admit: 2022-09-13 | Discharge: 2022-09-13 | Disposition: A | Discharge status: Home / Self Care | Payer: 59 | Source: Ambulatory Visit | Attending: Radiation Oncology | Admitting: Radiation Oncology

## 2022-09-13 DIAGNOSIS — C50411 Malignant neoplasm of upper-outer quadrant of right female breast: Secondary | ICD-10-CM | POA: Diagnosis not present

## 2022-09-13 LAB — RAD ONC ARIA SESSION SUMMARY
Course Elapsed Days: 10
Plan Fractions Treated to Date: 8
Plan Prescribed Dose Per Fraction: 2.66 Gy
Plan Total Fractions Prescribed: 16
Plan Total Prescribed Dose: 42.56 Gy
Reference Point Dosage Given to Date: 21.28 Gy
Reference Point Session Dosage Given: 2.66 Gy
Session Number: 8

## 2022-09-14 ENCOUNTER — Ambulatory Visit
Admission: RE | Admit: 2022-09-14 | Discharge: 2022-09-14 | Disposition: A | Discharge status: Home / Self Care | Payer: 59 | Source: Ambulatory Visit | Attending: Radiation Oncology | Admitting: Radiation Oncology

## 2022-09-14 ENCOUNTER — Other Ambulatory Visit: Payer: Self-pay

## 2022-09-14 DIAGNOSIS — C50411 Malignant neoplasm of upper-outer quadrant of right female breast: Secondary | ICD-10-CM

## 2022-09-14 LAB — RAD ONC ARIA SESSION SUMMARY
Course Elapsed Days: 11
Plan Fractions Treated to Date: 9
Plan Prescribed Dose Per Fraction: 2.66 Gy
Plan Total Fractions Prescribed: 16
Plan Total Prescribed Dose: 42.56 Gy
Reference Point Dosage Given to Date: 23.94 Gy
Reference Point Session Dosage Given: 2.66 Gy
Session Number: 9

## 2022-09-14 MED ORDER — RADIAPLEXRX EX GEL
Freq: Once | CUTANEOUS | Status: AC
  Administered 2022-09-14: 1 via TOPICAL

## 2022-09-17 ENCOUNTER — Ambulatory Visit: Admission: RE | Admit: 2022-09-17 | Payer: 59 | Source: Ambulatory Visit

## 2022-09-17 ENCOUNTER — Other Ambulatory Visit: Payer: Self-pay

## 2022-09-17 DIAGNOSIS — C50411 Malignant neoplasm of upper-outer quadrant of right female breast: Secondary | ICD-10-CM | POA: Diagnosis not present

## 2022-09-17 LAB — RAD ONC ARIA SESSION SUMMARY
Course Elapsed Days: 14
Plan Fractions Treated to Date: 10
Plan Prescribed Dose Per Fraction: 2.66 Gy
Plan Total Fractions Prescribed: 16
Plan Total Prescribed Dose: 42.56 Gy
Reference Point Dosage Given to Date: 26.6 Gy
Reference Point Session Dosage Given: 2.66 Gy
Session Number: 10

## 2022-09-18 ENCOUNTER — Other Ambulatory Visit: Payer: Self-pay

## 2022-09-18 ENCOUNTER — Ambulatory Visit
Admission: RE | Admit: 2022-09-18 | Discharge: 2022-09-18 | Disposition: A | Discharge status: Home / Self Care | Payer: 59 | Source: Ambulatory Visit | Attending: Radiation Oncology | Admitting: Radiation Oncology

## 2022-09-18 DIAGNOSIS — C50411 Malignant neoplasm of upper-outer quadrant of right female breast: Secondary | ICD-10-CM | POA: Diagnosis not present

## 2022-09-18 LAB — RAD ONC ARIA SESSION SUMMARY
Course Elapsed Days: 15
Plan Fractions Treated to Date: 11
Plan Prescribed Dose Per Fraction: 2.66 Gy
Plan Total Fractions Prescribed: 16
Plan Total Prescribed Dose: 42.56 Gy
Reference Point Dosage Given to Date: 29.26 Gy
Reference Point Session Dosage Given: 2.66 Gy
Session Number: 11

## 2022-09-19 ENCOUNTER — Other Ambulatory Visit: Payer: Self-pay

## 2022-09-19 ENCOUNTER — Ambulatory Visit
Admission: RE | Admit: 2022-09-19 | Discharge: 2022-09-19 | Disposition: A | Discharge status: Home / Self Care | Payer: 59 | Source: Ambulatory Visit | Attending: Radiation Oncology | Admitting: Radiation Oncology

## 2022-09-19 DIAGNOSIS — C50411 Malignant neoplasm of upper-outer quadrant of right female breast: Secondary | ICD-10-CM | POA: Diagnosis not present

## 2022-09-19 LAB — RAD ONC ARIA SESSION SUMMARY
Course Elapsed Days: 16
Plan Fractions Treated to Date: 12
Plan Prescribed Dose Per Fraction: 2.66 Gy
Plan Total Fractions Prescribed: 16
Plan Total Prescribed Dose: 42.56 Gy
Reference Point Dosage Given to Date: 31.92 Gy
Reference Point Session Dosage Given: 2.66 Gy
Session Number: 12

## 2022-09-20 ENCOUNTER — Other Ambulatory Visit: Payer: Self-pay

## 2022-09-20 ENCOUNTER — Ambulatory Visit
Admission: RE | Admit: 2022-09-20 | Discharge: 2022-09-20 | Disposition: A | Discharge status: Home / Self Care | Payer: 59 | Source: Ambulatory Visit | Attending: Radiation Oncology | Admitting: Radiation Oncology

## 2022-09-20 DIAGNOSIS — C50411 Malignant neoplasm of upper-outer quadrant of right female breast: Secondary | ICD-10-CM | POA: Diagnosis not present

## 2022-09-20 LAB — RAD ONC ARIA SESSION SUMMARY
Course Elapsed Days: 17
Plan Fractions Treated to Date: 13
Plan Prescribed Dose Per Fraction: 2.66 Gy
Plan Total Fractions Prescribed: 16
Plan Total Prescribed Dose: 42.56 Gy
Reference Point Dosage Given to Date: 34.58 Gy
Reference Point Session Dosage Given: 2.66 Gy
Session Number: 13

## 2022-09-21 ENCOUNTER — Ambulatory Visit: Payer: 59 | Admitting: Radiation Oncology

## 2022-09-21 ENCOUNTER — Ambulatory Visit: Payer: 59

## 2022-09-21 ENCOUNTER — Ambulatory Visit: Admission: RE | Admit: 2022-09-21 | Payer: 59 | Source: Ambulatory Visit

## 2022-09-24 ENCOUNTER — Other Ambulatory Visit: Payer: Self-pay

## 2022-09-24 ENCOUNTER — Ambulatory Visit
Admission: RE | Admit: 2022-09-24 | Discharge: 2022-09-24 | Disposition: A | Discharge status: Home / Self Care | Payer: 59 | Source: Ambulatory Visit | Attending: Radiation Oncology | Admitting: Radiation Oncology

## 2022-09-24 DIAGNOSIS — C50411 Malignant neoplasm of upper-outer quadrant of right female breast: Secondary | ICD-10-CM | POA: Diagnosis not present

## 2022-09-24 LAB — RAD ONC ARIA SESSION SUMMARY
Course Elapsed Days: 21
Plan Fractions Treated to Date: 14
Plan Prescribed Dose Per Fraction: 2.66 Gy
Plan Total Fractions Prescribed: 16
Plan Total Prescribed Dose: 42.56 Gy
Reference Point Dosage Given to Date: 37.24 Gy
Reference Point Session Dosage Given: 2.66 Gy
Session Number: 14

## 2022-09-25 ENCOUNTER — Other Ambulatory Visit: Payer: Self-pay

## 2022-09-25 ENCOUNTER — Ambulatory Visit
Admission: RE | Admit: 2022-09-25 | Discharge: 2022-09-25 | Disposition: A | Discharge status: Home / Self Care | Payer: 59 | Source: Ambulatory Visit | Attending: Radiation Oncology | Admitting: Radiation Oncology

## 2022-09-25 DIAGNOSIS — C50411 Malignant neoplasm of upper-outer quadrant of right female breast: Secondary | ICD-10-CM | POA: Diagnosis not present

## 2022-09-25 LAB — RAD ONC ARIA SESSION SUMMARY
Course Elapsed Days: 22
Plan Fractions Treated to Date: 15
Plan Prescribed Dose Per Fraction: 2.66 Gy
Plan Total Fractions Prescribed: 16
Plan Total Prescribed Dose: 42.56 Gy
Reference Point Dosage Given to Date: 39.9 Gy
Reference Point Session Dosage Given: 2.66 Gy
Session Number: 15

## 2022-09-26 ENCOUNTER — Other Ambulatory Visit: Payer: Self-pay

## 2022-09-26 ENCOUNTER — Ambulatory Visit
Admission: RE | Admit: 2022-09-26 | Discharge: 2022-09-26 | Disposition: A | Discharge status: Home / Self Care | Payer: 59 | Source: Ambulatory Visit | Attending: Radiation Oncology | Admitting: Radiation Oncology

## 2022-09-26 DIAGNOSIS — C50411 Malignant neoplasm of upper-outer quadrant of right female breast: Secondary | ICD-10-CM | POA: Diagnosis not present

## 2022-09-26 LAB — RAD ONC ARIA SESSION SUMMARY
Course Elapsed Days: 23
Plan Fractions Treated to Date: 16
Plan Prescribed Dose Per Fraction: 2.66 Gy
Plan Total Fractions Prescribed: 16
Plan Total Prescribed Dose: 42.56 Gy
Reference Point Dosage Given to Date: 42.56 Gy
Reference Point Session Dosage Given: 2.66 Gy
Session Number: 16

## 2022-09-27 ENCOUNTER — Other Ambulatory Visit: Payer: Self-pay

## 2022-09-27 ENCOUNTER — Ambulatory Visit
Admission: RE | Admit: 2022-09-27 | Discharge: 2022-09-27 | Disposition: A | Discharge status: Home / Self Care | Payer: 59 | Source: Ambulatory Visit | Attending: Radiation Oncology | Admitting: Radiation Oncology

## 2022-09-27 DIAGNOSIS — C50411 Malignant neoplasm of upper-outer quadrant of right female breast: Secondary | ICD-10-CM | POA: Diagnosis not present

## 2022-09-27 LAB — RAD ONC ARIA SESSION SUMMARY
Course Elapsed Days: 24
Plan Fractions Treated to Date: 1
Plan Prescribed Dose Per Fraction: 2 Gy
Plan Total Fractions Prescribed: 4
Plan Total Prescribed Dose: 8 Gy
Reference Point Dosage Given to Date: 2 Gy
Reference Point Session Dosage Given: 2 Gy
Session Number: 17

## 2022-09-28 ENCOUNTER — Other Ambulatory Visit: Payer: Self-pay

## 2022-09-28 ENCOUNTER — Ambulatory Visit
Admission: RE | Admit: 2022-09-28 | Discharge: 2022-09-28 | Disposition: A | Discharge status: Home / Self Care | Payer: 59 | Source: Ambulatory Visit | Attending: Radiation Oncology | Admitting: Radiation Oncology

## 2022-09-28 DIAGNOSIS — Z170 Estrogen receptor positive status [ER+]: Secondary | ICD-10-CM

## 2022-09-28 DIAGNOSIS — C50411 Malignant neoplasm of upper-outer quadrant of right female breast: Secondary | ICD-10-CM | POA: Diagnosis not present

## 2022-09-28 LAB — RAD ONC ARIA SESSION SUMMARY
Course Elapsed Days: 25
Plan Fractions Treated to Date: 2
Plan Prescribed Dose Per Fraction: 2 Gy
Plan Total Fractions Prescribed: 4
Plan Total Prescribed Dose: 8 Gy
Reference Point Dosage Given to Date: 4 Gy
Reference Point Session Dosage Given: 2 Gy
Session Number: 18

## 2022-09-28 MED ORDER — RADIAPLEXRX EX GEL: Freq: Once | CUTANEOUS | Status: AC

## 2022-09-30 NOTE — Progress Notes (Signed)
Patient Care Team: Andrea Palmer, MD as PCP - General (Family Medicine) Andrea Croissant, MD as Consulting Physician (Hematology and Oncology) Andrea Lint, MD as Consulting Physician (General Surgery) Andrea Puffer, MD as Consulting Physician (Radiation Oncology) Andrea Proud, RN as Oncology Nurse Navigator Andrea Angelica, RN as Oncology Nurse Navigator  DIAGNOSIS: No diagnosis found.  SUMMARY OF ONCOLOGIC HISTORY: Oncology History  Malignant neoplasm of upper-outer quadrant of right breast in female, estrogen receptor positive (HCC)  05/17/2022 Initial Diagnosis   Screening mammogram detected right breast asymmetry 12 o'clock position anteriorly measuring 0.7 cm, posteriorly there was enhancement (stereotactic biopsy: Benign) biopsy of the anterior abnormality revealed grade 1 IDC with low-grade DCIS and calcifications ER 80%, PR 100%, HER2 equivocal by IHC, FISH negative, Ki-67 5%   05/21/2022 Cancer Staging   Staging form: Breast, AJCC 8th Edition - Clinical stage from 05/21/2022: cT1b, cN0, cM0, G1 - Signed by Andrea Croissant, MD on 05/23/2022 Stage prefix: Initial diagnosis Method of lymph node assessment: Clinical Histologic grading system: 3 grade system    Genetic Testing   [facility] Common Cancer Panel+RNA was Negative. Report date is 05/29/2022.  The Common Hereditary Cancers Panel offered by [facility] includes sequencing and/or deletion duplication testing of the following 48 genes: APC, ATM, AXIN2, BAP1, BARD1, BMPR1A, BRCA1, BRCA2, BRIP1, CDH1, CDK4, CDKN2A (p14ARF and p16INK4a only), CHEK2, CTNNA1, DICER1, EPCAM (Deletion/duplication testing only), FH, GREM1 (promoter region duplication testing only), HOXB13, KIT, MBD4, MEN1, MLH1, MSH2, MSH3, MSH6, MUTYH, NF1, NHTL1, PALB2, PDGFRA, PMS2, POLD1, POLE, PTEN, RAD51C, RAD51D, SDHA (sequencing analysis only except exon 14), SDHB, SDHC, SDHD, SMAD4, SMARCA4. STK11, TP53, TSC1, TSC2, and VHL.     CHIEF COMPLIANT:   INTERVAL  HISTORY: Devin Wilfert Alzate is a   ALLERGIES:  is allergic to sulfamethoxazole-trimethoprim, ciprofloxacin, codeine, and latex.  MEDICATIONS:  Current Outpatient Medications  Medication Sig Dispense Refill   ibuprofen (ADVIL) 800 MG tablet Take 1 tablet (800 mg total) by mouth every 8 (eight) hours as needed. 30 tablet 0   ibuprofen (ADVIL,MOTRIN) 200 MG tablet Take 200 mg by mouth every 6 (six) hours as needed.     oxyCODONE (OXY IR/ROXICODONE) 5 MG immediate release tablet Take 1 tablet (5 mg total) by mouth every 6 (six) hours as needed for severe pain. 15 tablet 0   traMADol-acetaminophen (ULTRACET) 37.5-325 MG tablet Take 1 tablet by mouth every 6 (six) hours as needed. 30 tablet 0   No current facility-administered medications for this visit.    PHYSICAL EXAMINATION: ECOG PERFORMANCE STATUS: {CHL ONC ECOG PS:[phone]}  There were no vitals filed for this visit. There were no vitals filed for this visit.  BREAST:*** No palpable masses or nodules in either right or left breasts. No palpable axillary supraclavicular or infraclavicular adenopathy no breast tenderness or nipple discharge. (exam performed in the presence of a chaperone)  LABORATORY DATA:  I have reviewed the data as listed    Latest Ref Rng & Units 06/26/2022    8:24 AM 06/25/2022    5:32 AM 06/24/2022    5:10 AM  CMP  Glucose 70 - 99 mg/dL 829  99  97   BUN 6 - 20 mg/dL <5  6  <5   Creatinine 0.44 - 1.00 mg/dL 5.62  1.30  8.65   Sodium 135 - 145 mmol/L 135  137  137   Potassium 3.5 - 5.1 mmol/L 4.1  4.5  4.4   Chloride 98 - 111 mmol/L 96  100  100  CO2 22 - 32 mmol/L 27  28  26    Calcium 8.9 - 10.3 mg/dL 9.2  8.9  9.0   Total Protein 6.5 - 8.1 g/dL 6.6  6.0  5.7   Total Bilirubin 0.3 - 1.2 mg/dL 0.3  0.3  0.2   Alkaline Phos 38 - 126 U/L 407  348  369   AST 15 - 41 U/L 74  118  96   ALT 0 - 44 U/L 145  169  162     Lab Results  Component Value Date   WBC 12.2 (H) 06/26/2022   HGB 11.6 (L) 06/26/2022    HCT 34.3 (L) 06/26/2022   MCV 87.7 06/26/2022   PLT 730 (H) 06/26/2022   NEUTROABS 8.8 (H) 06/26/2022    ASSESSMENT & PLAN:  No problem-specific Assessment & Plan notes found for this encounter.    No orders of the defined types were placed in this encounter.  The patient has a good understanding of the overall plan. she agrees with it. she will call with any problems that may develop before the next visit here. Total time spent: 30 mins including face to face time and time spent for planning, charting and co-ordination of care   Sherlyn Lick, CMA 09/30/22    I Janan Ridge am acting as a Neurosurgeon for [employer]  ***

## 2022-10-01 ENCOUNTER — Ambulatory Visit: Payer: 59

## 2022-10-01 ENCOUNTER — Ambulatory Visit
Admission: RE | Admit: 2022-10-01 | Discharge: 2022-10-01 | Disposition: A | Discharge status: Home / Self Care | Payer: 59 | Source: Ambulatory Visit | Attending: Radiation Oncology | Admitting: Radiation Oncology

## 2022-10-01 ENCOUNTER — Other Ambulatory Visit: Payer: Self-pay

## 2022-10-01 DIAGNOSIS — C50411 Malignant neoplasm of upper-outer quadrant of right female breast: Secondary | ICD-10-CM | POA: Diagnosis not present

## 2022-10-01 LAB — RAD ONC ARIA SESSION SUMMARY
Course Elapsed Days: 28
Plan Fractions Treated to Date: 3
Plan Prescribed Dose Per Fraction: 2 Gy
Plan Total Fractions Prescribed: 4
Plan Total Prescribed Dose: 8 Gy
Reference Point Dosage Given to Date: 6 Gy
Reference Point Session Dosage Given: 2 Gy
Session Number: 19

## 2022-10-02 ENCOUNTER — Other Ambulatory Visit: Payer: Self-pay

## 2022-10-02 ENCOUNTER — Ambulatory Visit
Admission: RE | Admit: 2022-10-02 | Discharge: 2022-10-02 | Disposition: A | Discharge status: Home / Self Care | Payer: 59 | Source: Ambulatory Visit | Attending: Radiation Oncology | Admitting: Radiation Oncology

## 2022-10-02 DIAGNOSIS — N739 Female pelvic inflammatory disease, unspecified: Secondary | ICD-10-CM

## 2022-10-02 DIAGNOSIS — G8929 Other chronic pain: Secondary | ICD-10-CM

## 2022-10-02 DIAGNOSIS — C50411 Malignant neoplasm of upper-outer quadrant of right female breast: Secondary | ICD-10-CM | POA: Diagnosis not present

## 2022-10-02 DIAGNOSIS — Z170 Estrogen receptor positive status [ER+]: Secondary | ICD-10-CM

## 2022-10-02 DIAGNOSIS — Z1379 Encounter for other screening for genetic and chromosomal anomalies: Secondary | ICD-10-CM

## 2022-10-02 DIAGNOSIS — M2242 Chondromalacia patellae, left knee: Secondary | ICD-10-CM

## 2022-10-02 LAB — RAD ONC ARIA SESSION SUMMARY
Course Elapsed Days: 29
Plan Fractions Treated to Date: 4
Plan Prescribed Dose Per Fraction: 2 Gy
Plan Total Fractions Prescribed: 4
Plan Total Prescribed Dose: 8 Gy
Reference Point Dosage Given to Date: 8 Gy
Reference Point Session Dosage Given: 2 Gy
Session Number: 20

## 2022-10-03 ENCOUNTER — Encounter: Payer: Self-pay | Admitting: *Deleted

## 2022-10-03 NOTE — Radiation Completion Notes (Signed)
  Radiation Oncology         [phone] ________________________________  Name: Andrea Johns MRN: [id number]  Date of Service: 10/02/2022  DOB: 14-Sep-1970  End of Treatment Note    Diagnosis: Stage IA, pT1bN0M0, Grade 1, ER/PR positive invasive ductal carcinoma of the right breast.   Intent: Curative     ==========DELIVERED PLANS==========  First Treatment Date: 2022-09-03 - Last Treatment Date: 2022-10-02   Plan Name: Breast_R Site: Breast, Right Technique: 3D Mode: Photon Dose Per Fraction: 2.66 Gy Prescribed Dose (Delivered / Prescribed): 42.56 Gy / 42.56 Gy Prescribed Fxs (Delivered / Prescribed): 16 / 16   Plan Name: Breast_R_Bst Site: Breast, Right Technique: 3D Mode: Photon Dose Per Fraction: 2 Gy Prescribed Dose (Delivered / Prescribed): 8 Gy / 8 Gy Prescribed Fxs (Delivered / Prescribed): 4 / 4     ==========ON TREATMENT VISIT DATES========== 2022-09-05, 2022-09-14, 2022-09-25, 2022-09-28     See weekly On Treatment Notes in Epic for details.The patient tolerated radiation. She developed fatigue and anticipated skin changes in the treatment field.   The patient will receive a call in about one month from the radiation oncology department. She will continue follow up with Dr. Pamelia Hoit as well.      Osker Mason, PAC

## 2022-10-04 ENCOUNTER — Inpatient Hospital Stay: Payer: 59 | Attending: Hematology and Oncology | Admitting: Hematology and Oncology

## 2022-10-04 ENCOUNTER — Other Ambulatory Visit: Payer: Self-pay

## 2022-10-04 VITALS — BP 143/86 | HR 79 | Temp 97.7°F | Resp 18 | Ht 61.75 in | Wt 143.5 lb

## 2022-10-04 DIAGNOSIS — Z170 Estrogen receptor positive status [ER+]: Secondary | ICD-10-CM | POA: Insufficient documentation

## 2022-10-04 DIAGNOSIS — C50411 Malignant neoplasm of upper-outer quadrant of right female breast: Secondary | ICD-10-CM | POA: Diagnosis present

## 2022-10-04 DIAGNOSIS — Z923 Personal history of irradiation: Secondary | ICD-10-CM | POA: Insufficient documentation

## 2022-10-04 NOTE — Assessment & Plan Note (Addendum)
05/17/22: Screening mammogram detected right breast asymmetry 12 o'clock position anteriorly measuring 0.7 cm, posteriorly there was enhancement (stereotactic biopsy: Benign) biopsy of the anterior abnormality revealed grade 1 IDC with low-grade DCIS and calcifications ER 80%, PR 100%, HER2 equivocal by IHC, FISH negative, Ki-67 5%    Treatment plan: 1. Breast conserving surgery 07/17/2022: Grade 1 IDC 0.9 cm with DCIS, margins negative, ER 80%, PR 100%, HER2 2+ by IHC, FISH negative, Ki-67 5% 0/4 lymph nodes negative 2. did not recommend Oncotype testing since the final tumor size was less than 1 cm and grade 1  3. Adjuvant radiation therapy completed 10/02/2022 4. Adjuvant antiestrogen therapy -----------------------------------------------------------------------------------------------------------------------------------------  Ovarian cyst: Hospitalized 06/22/2022-06/26/2022: Pelvic inflammatory disease treated with antibiotics  Patient is not interested in taking tamoxifen. For breast cancer surveillance recommended Signatera testing.  Follow-up in 3 months for survivorship care plan visit.

## 2022-10-09 ENCOUNTER — Telehealth: Payer: Self-pay

## 2022-10-09 DIAGNOSIS — Z170 Estrogen receptor positive status [ER+]: Secondary | ICD-10-CM

## 2022-10-26 ENCOUNTER — Telehealth: Payer: Self-pay

## 2022-10-26 NOTE — Telephone Encounter (Signed)
Received email from [place] stating they are unable to get in touch with the pt for lab draw. Attempted to call pt and LVM for call back to let her know.

## 2022-10-29 ENCOUNTER — Ambulatory Visit: Payer: 59

## 2022-11-30 NOTE — Telephone Encounter (Signed)
Refer to 4/17 note

## 2022-12-03 ENCOUNTER — Ambulatory Visit
Admission: RE | Admit: 2022-12-03 | Discharge: 2022-12-03 | Disposition: A | Discharge status: Home / Self Care | Payer: 59 | Source: Ambulatory Visit | Attending: Radiation Oncology | Admitting: Radiation Oncology

## 2022-12-03 NOTE — Progress Notes (Signed)
Radiation Oncology         [phone] ________________________________  Name: Andrea Johns MRN: [id number]  Date of Service: 12/03/2022  DOB: 1971/02/10  Post Treatment Telephone Note  Diagnosis:  Stage IA, pT1bN0M0, Grade 1, ER/PR positive invasive ductal carcinoma of the right breast. (as documented in provider EOT note)  The patient was not available for call today.  The patient was encouraged to avoid sun exposure in the area of prior treatment for up to one year following radiation with either sunscreen or by the style of clothing worn in the sun.  The patient has scheduled follow up with her medical oncologist Dr. Pamelia Hoit for ongoing surveillance, and was encouraged to call if she develops concerns or questions regarding radiation.    Ruel Favors, LPN

## 2023-01-04 ENCOUNTER — Inpatient Hospital Stay: Payer: 59 | Attending: Hematology and Oncology | Admitting: Adult Health

## 2023-01-04 ENCOUNTER — Telehealth: Payer: Self-pay

## 2023-01-04 ENCOUNTER — Encounter: Payer: Self-pay | Admitting: Adult Health

## 2023-01-04 VITALS — BP 145/84 | HR 61 | Resp 18 | Wt 148.9 lb

## 2023-01-04 DIAGNOSIS — Z170 Estrogen receptor positive status [ER+]: Secondary | ICD-10-CM | POA: Diagnosis not present

## 2023-01-04 DIAGNOSIS — Z1211 Encounter for screening for malignant neoplasm of colon: Secondary | ICD-10-CM | POA: Diagnosis not present

## 2023-01-04 DIAGNOSIS — C50411 Malignant neoplasm of upper-outer quadrant of right female breast: Secondary | ICD-10-CM | POA: Insufficient documentation

## 2023-01-04 DIAGNOSIS — Z923 Personal history of irradiation: Secondary | ICD-10-CM | POA: Diagnosis not present

## 2023-01-04 NOTE — Telephone Encounter (Signed)
Referral faxed to [facility]. Fax confirmation received.
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
## 2023-01-07 ENCOUNTER — Telehealth: Payer: Self-pay | Admitting: Adult Health

## 2023-01-07 NOTE — Telephone Encounter (Signed)
 Left patient a vm regarding upcoming appointment

## 2023-02-14 ENCOUNTER — Telehealth: Payer: Self-pay | Admitting: Adult Health

## 2023-05-23 ENCOUNTER — Ambulatory Visit
Admission: RE | Admit: 2023-05-23 | Discharge: 2023-05-23 | Disposition: A | Discharge status: Home / Self Care | Payer: Self-pay | Source: Ambulatory Visit | Attending: Adult Health | Admitting: Adult Health

## 2023-05-23 DIAGNOSIS — C50411 Malignant neoplasm of upper-outer quadrant of right female breast: Secondary | ICD-10-CM

## 2023-08-07 ENCOUNTER — Ambulatory Visit: Payer: 59 | Admitting: Adult Health

## 2023-10-10 ENCOUNTER — Encounter: Payer: Self-pay | Admitting: Adult Health

## 2023-10-10 ENCOUNTER — Inpatient Hospital Stay: Payer: Self-pay | Attending: Adult Health | Admitting: Adult Health

## 2023-10-10 ENCOUNTER — Telehealth: Payer: Self-pay

## 2023-10-10 VITALS — BP 177/94 | HR 73 | Temp 98.1°F | Resp 17 | Wt 149.4 lb

## 2023-10-10 DIAGNOSIS — R635 Abnormal weight gain: Secondary | ICD-10-CM | POA: Diagnosis not present

## 2023-10-10 DIAGNOSIS — Z803 Family history of malignant neoplasm of breast: Secondary | ICD-10-CM | POA: Insufficient documentation

## 2023-10-10 DIAGNOSIS — C50411 Malignant neoplasm of upper-outer quadrant of right female breast: Secondary | ICD-10-CM | POA: Insufficient documentation

## 2023-10-10 DIAGNOSIS — Z170 Estrogen receptor positive status [ER+]: Secondary | ICD-10-CM | POA: Diagnosis not present

## 2023-10-10 DIAGNOSIS — Z923 Personal history of irradiation: Secondary | ICD-10-CM | POA: Diagnosis not present

## 2023-10-10 DIAGNOSIS — G479 Sleep disorder, unspecified: Secondary | ICD-10-CM | POA: Insufficient documentation

## 2023-10-10 DIAGNOSIS — Z801 Family history of malignant neoplasm of trachea, bronchus and lung: Secondary | ICD-10-CM | POA: Diagnosis not present

## 2023-10-10 DIAGNOSIS — I10 Essential (primary) hypertension: Secondary | ICD-10-CM | POA: Insufficient documentation
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
## 2023-10-10 NOTE — Telephone Encounter (Signed)
 Orders for Guardant Reveal testing faxed and confirmation received

## 2023-10-22 ENCOUNTER — Encounter: Payer: Self-pay | Admitting: Adult Health

## 2023-12-11 ENCOUNTER — Encounter: Payer: Self-pay | Admitting: Adult Health

## 2024-03-11 ENCOUNTER — Other Ambulatory Visit: Payer: Self-pay | Admitting: Hematology and Oncology

## 2024-03-11 ENCOUNTER — Other Ambulatory Visit: Payer: Self-pay

## 2024-03-11 DIAGNOSIS — Z1379 Encounter for other screening for genetic and chromosomal anomalies: Secondary | ICD-10-CM

## 2024-03-11 DIAGNOSIS — Z9889 Other specified postprocedural states: Secondary | ICD-10-CM

## 2024-03-12 ENCOUNTER — Ambulatory Visit: Admitting: Family

## 2024-03-12 ENCOUNTER — Ambulatory Visit: Payer: Self-pay

## 2024-03-12 ENCOUNTER — Encounter: Payer: Self-pay | Admitting: Family

## 2024-03-12 VITALS — BP 132/80 | HR 88 | Temp 98.1°F | Ht 61.0 in | Wt 148.0 lb

## 2024-03-12 DIAGNOSIS — I159 Secondary hypertension, unspecified: Secondary | ICD-10-CM | POA: Diagnosis not present

## 2024-03-12 DIAGNOSIS — Z170 Estrogen receptor positive status [ER+]: Secondary | ICD-10-CM

## 2024-03-12 DIAGNOSIS — Z780 Asymptomatic menopausal state: Secondary | ICD-10-CM

## 2024-03-12 DIAGNOSIS — Z1211 Encounter for screening for malignant neoplasm of colon: Secondary | ICD-10-CM

## 2024-03-12 DIAGNOSIS — R7309 Other abnormal glucose: Secondary | ICD-10-CM | POA: Diagnosis not present

## 2024-03-12 DIAGNOSIS — Z136 Encounter for screening for cardiovascular disorders: Secondary | ICD-10-CM | POA: Diagnosis not present

## 2024-03-12 DIAGNOSIS — I10 Essential (primary) hypertension: Secondary | ICD-10-CM | POA: Insufficient documentation

## 2024-03-12 DIAGNOSIS — C50411 Malignant neoplasm of upper-outer quadrant of right female breast: Secondary | ICD-10-CM

## 2024-03-12 DIAGNOSIS — Z13220 Encounter for screening for lipoid disorders: Secondary | ICD-10-CM | POA: Diagnosis not present

## 2024-03-12 MED ORDER — AMLODIPINE BESYLATE 2.5 MG PO TABS: 2.5000 mg | ORAL_TABLET | Freq: Every day | ORAL | 1 refills | Status: AC

## 2024-03-12 NOTE — Assessment & Plan Note (Addendum)
 Chronic, improved from earlier. Discussed blood pressure can be slightly labile.  We discussedom earlier today.  An additional prescription of amlodipine  2.5 mg to take if blood pressures greater than 130-135/80.  Continue amlodipine  5 mg daily.  Pending echocardiogram due to labile blood pressure readings and history of radiation in setting of breast cancer. Close follow-up

## 2024-03-12 NOTE — Progress Notes (Signed)
 "  Assessment & Plan:  Secondary hypertension Assessment & Plan: Chronic, improved from earlier. Discussed blood pressure can be slightly labile.  We discussedom earlier today.  An additional prescription of amlodipine  2.5 mg to take if blood pressures greater than 130-135/80.  Continue amlodipine  5 mg daily.  Pending echocardiogram due to labile blood pressure readings and history of radiation in setting of breast cancer. Close follow-up  Orders: -     CBC with Differential/Platelet; Future -     Comprehensive metabolic panel with GFR; Future -     TSH; Future -     amLODIPine  Besylate; Take 1 tablet (2.5 mg total) by mouth daily.  Dispense: 90 tablet; Refill: 1 -     ECHOCARDIOGRAM COMPLETE; Future  Malignant neoplasm of upper-outer quadrant of right breast in female, estrogen receptor positive (HCC)  Screen for colon cancer -     Ambulatory referral to Gastroenterology  Encounter for lipid screening for cardiovascular disease -     Lipid panel; Future  Elevated glucose -     Hemoglobin A1c; Future  Asymptomatic postmenopausal state -     VITAMIN D 25 Hydroxy (Vit-D Deficiency, Fractures); Future     Return precautions given.   Risks, benefits, and alternatives of the medications and treatment plan prescribed today were discussed, and patient expressed understanding.   Education regarding symptom management and diagnosis given to patient on AVS either electronically or printed.  Return in about 1 month (around 04/12/2024) for Fasting labs in 2-3 weeks.  Andrea Northern, FNP  Subjective:    Patient ID: Andrea Johns, female    DOB: [birth year], 54 y.o.   MRN: [id number]  CC: Andrea Johns is a 54 y.o. female who presents today to establish care  HPI: HPI Discussed the use of AI scribe software for clinical note transcription with the patient, who gave verbal consent to proceed.  History of Present Illness   Andrea Johns is a 54 year old female with  hypertension who presents to establish care and address concerns about her blood pressure.  Recent measurements at her OBGYN's office showed 158/100 mmHg this morning. She uses a home blood pressure machine, which she believes is accurate, and notes that her blood pressure is 'never normal' with the diastolic number always slightly elevated. She is currently taking amlodipine  5 mg daily.  She has a history of breast cancer diagnosed two years ago, treated with radiation and lumpectomy. She reports that since her cancer treatment, her blood pressure has been 'all over the place.' She also mentions weight gain since her cancer treatment, despite counting calories and walking regularly.   Denies CP, SOB. Occasional dull headaches.She describes the headaches as similar to sinus issues she had before her balloon sinuplasty about five years ago.  Following with [facility] surgery , last seen 10/18/2023  She has a history of stage 1A invasive ductal carcinoma, diagnosed in March 2024, treated with lumpectomy and adjuvant radiation. Her most recent mammogram on May 23, 2023, shows no evidence of malignancy.    Allergies: Sulfamethoxazole-trimethoprim, Ciprofloxacin, Codeine, and Latex Medications Ordered Prior to Encounter[1]  Review of Systems  Constitutional:  Negative for chills and fever.  Respiratory:  Negative for cough.   Cardiovascular:  Negative for chest pain and palpitations.  Gastrointestinal:  Negative for nausea and vomiting.      Objective:    BP 132/80   Pulse 88   Temp 98.1 F (36.7 C) (Oral)   Ht 5'  1 (1.549 m)   Wt 148 lb (67.1 kg)   LMP  (LMP Unknown)   SpO2 97%   BMI 27.96 kg/m   BP Readings from Last 3 Encounters:  03/12/24 132/80  10/10/23 (!) 177/94  01/04/23 (!) 145/84   Wt Readings from Last 3 Encounters:  03/12/24 148 lb (67.1 kg)  10/10/23 149 lb 6.4 oz (67.8 kg)  01/04/23 148 lb 14.4 oz (67.5 kg)    Physical Exam Vitals reviewed.   Constitutional:      Appearance: She is well-developed.  Eyes:     Conjunctiva/sclera: Conjunctivae normal.  Cardiovascular:     Rate and Rhythm: Normal rate and regular rhythm.     Pulses: Normal pulses.     Heart sounds: Normal heart sounds.  Pulmonary:     Effort: Pulmonary effort is normal.     Breath sounds: Normal breath sounds. No wheezing, rhonchi or rales.  Skin:    General: Skin is warm and dry.  Neurological:     Mental Status: She is alert.  Psychiatric:        Speech: Speech normal.        Behavior: Behavior normal.        Thought Content: Thought content normal.          [1]  Current Outpatient Medications on File Prior to Visit  Medication Sig Dispense Refill   amLODipine  (NORVASC ) 5 MG tablet Take 5 mg by mouth daily.     No current facility-administered medications on file prior to visit.   "

## 2024-03-12 NOTE — Patient Instructions (Addendum)
 May take amlodipine  2.5mg  if blood pressure greater than 130-135/80  Continue amlodipine  5mg  daily    Managing Your Hypertension Hypertension, also called high blood pressure, is when the force of the blood pressing against the walls of the arteries is too strong. Arteries are blood vessels that carry blood from your heart throughout your body. Hypertension forces the heart to work harder to pump blood and may cause the arteries to become narrow or stiff. Understanding blood pressure readings A blood pressure reading includes a higher number over a lower number: The first, or top, number is called the systolic pressure. It is a measure of the pressure in your arteries as your heart beats. The second, or bottom number, is called the diastolic pressure. It is a measure of the pressure in your arteries as the heart relaxes. For most people, a normal blood pressure is below 120/80. Your personal target blood pressure may vary depending on your medical conditions, your age, and other factors. Blood pressure is classified into four stages. Based on your blood pressure reading, your health care provider may use the following stages to determine what type of treatment you need, if any. Systolic pressure and diastolic pressure are measured in a unit called millimeters of mercury (mmHg). Normal Systolic pressure: below 120. Diastolic pressure: below 80. Elevated Systolic pressure: 120-129. Diastolic pressure: below 80. Hypertension stage 1 Systolic pressure: 130-139. Diastolic pressure: 80-89. Hypertension stage 2 Systolic pressure: 140 or above. Diastolic pressure: 90 or above. How can this condition affect me? Managing your hypertension is very important. Over time, hypertension can damage the arteries and decrease blood flow to parts of the body, including the brain, heart, and kidneys. Having untreated or uncontrolled hypertension can lead to: A heart attack. A stroke. A weakened blood vessel  (aneurysm). Heart failure. Kidney damage. Eye damage. Memory and concentration problems. Vascular dementia. What actions can I take to manage this condition? Hypertension can be managed by making lifestyle changes and possibly by taking medicines. Your health care provider will help you make a plan to bring your blood pressure within a normal range. You may be referred for counseling on a healthy diet and physical activity. Nutrition  Eat a diet that is high in fiber and potassium, and low in salt (sodium), added sugar, and fat. An example eating plan is called the DASH diet. DASH stands for Dietary Approaches to Stop Hypertension. To eat this way: Eat plenty of fresh fruits and vegetables. Try to fill one-half of your plate at each meal with fruits and vegetables. Eat whole grains, such as whole-wheat pasta, brown rice, or whole-grain bread. Fill about one-fourth of your plate with whole grains. Eat low-fat dairy products. Avoid fatty cuts of meat, processed or cured meats, and poultry with skin. Fill about one-fourth of your plate with lean proteins such as fish, chicken without skin, beans, eggs, and tofu. Avoid pre-made and processed foods. These tend to be higher in sodium, added sugar, and fat. Reduce your daily sodium intake. Many people with hypertension should eat less than 1,500 mg of sodium a day. Lifestyle  Work with your health care provider to maintain a healthy body weight or to lose weight. Ask what an ideal weight is for you. Get at least 30 minutes of exercise that causes your heart to beat faster (aerobic exercise) most days of the week. Activities may include walking, swimming, or biking. Include exercise to strengthen your muscles (resistance exercise), such as weight lifting, as part of your weekly exercise  routine. Try to do these types of exercises for 30 minutes at least 3 days a week. Do not use any products that contain nicotine or tobacco. These products include  cigarettes, chewing tobacco, and vaping devices, such as e-cigarettes. If you need help quitting, ask your health care provider. Control any long-term (chronic) conditions you have, such as high cholesterol or diabetes. Identify your sources of stress and find ways to manage stress. This may include meditation, deep breathing, or making time for fun activities. Alcohol use Do not drink alcohol if: Your health care provider tells you not to drink. You are pregnant, may be pregnant, or are planning to become pregnant. If you drink alcohol: Limit how much you have to: 0-1 drink a day for women. 0-2 drinks a day for men. Know how much alcohol is in your drink. In the [place] one drink equals one 12 oz bottle of beer (355 mL), one 5 oz glass of wine (148 mL), or one 1 oz glass of hard liquor (44 mL). Medicines Your health care provider may prescribe medicine if lifestyle changes are not enough to get your blood pressure under control and if: Your systolic blood pressure is 130 or higher. Your diastolic blood pressure is 80 or higher. Take medicines only as told by your health care provider. Follow the directions carefully. Blood pressure medicines must be taken as told by your health care provider. The medicine does not work as well when you skip doses. Skipping doses also puts you at risk for problems. Monitoring Before you monitor your blood pressure: Do not smoke, drink caffeinated beverages, or exercise within 30 minutes before taking a measurement. Use the bathroom and empty your bladder (urinate). Sit quietly for at least 5 minutes before taking measurements. Monitor your blood pressure at home as told by your health care provider. To do this: Sit with your back straight and supported. Place your feet flat on the floor. Do not cross your legs. Support your arm on a flat surface, such as a table. Make sure your upper arm is at heart level. Each time you measure, take two or three readings  one minute apart and record the results. You may also need to have your blood pressure checked regularly by your health care provider. General information Talk with your health care provider about your diet, exercise habits, and other lifestyle factors that may be contributing to hypertension. Review all the medicines you take with your health care provider because there may be side effects or interactions. Keep all follow-up visits. Your health care provider can help you create and adjust your plan for managing your high blood pressure. Where to find more information National Heart, Lung, and Blood Institute: popsteam.is American Heart Association: www.heart.org Contact a health care provider if: You think you are having a reaction to medicines you have taken. You have repeated (recurrent) headaches. You feel dizzy. You have swelling in your ankles. You have trouble with your vision. Get help right away if: You develop a severe headache or confusion. You have unusual weakness or numbness, or you feel faint. You have severe pain in your chest or abdomen. You vomit repeatedly. You have trouble breathing. These symptoms may be an emergency. Get help right away. Call 911. Do not wait to see if the symptoms will go away. Do not drive yourself to the hospital. Summary Hypertension is when the force of blood pumping through your arteries is too strong. If this condition is not controlled, it may put  you at risk for serious complications. Your personal target blood pressure may vary depending on your medical conditions, your age, and other factors. For most people, a normal blood pressure is less than 120/80. Hypertension is managed by lifestyle changes, medicines, or both. Lifestyle changes to help manage hypertension include losing weight, eating a healthy, low-sodium diet, exercising more, stopping smoking, and limiting alcohol. This information is not intended to replace advice given  to you by your health care provider. Make sure you discuss any questions you have with your health care provider. Document Revised: 11/03/2020 Document Reviewed: 11/03/2020 Elsevier Patient Education  2024 [employer].

## 2024-03-12 NOTE — Telephone Encounter (Signed)
 FYI Only or Action Required?: FYI only for provider: appointment scheduled on 1/8/202.  Called Nurse Triage reporting Hypertension.  Symptoms began ongoing.  Interventions attempted: Prescription medications: amlodipine .  Symptoms are: unchanged.  Triage Disposition: See PCP When Office is Open (Within 3 Days)  Patient/caregiver understands and will follow disposition?: Yes  Copied from CRM #[id number]. Topic: Clinical - Red Word Triage >> Mar 12, 2024 10:52 AM Donna BRAVO wrote: Red Word that prompted transfer to Nurse Triage:  high BP  03/12/24 150/100 a little nausea low grade headache   Reason for Disposition  Systolic BP >= 160 OR Diastolic >= 100  Answer Assessment - Initial Assessment Questions 2. ONSET: When did you take your blood pressure?     Patient went to gyn appointment. Patient had breast cancer and an infection and says her pressure has been high ever since 3. HOW: How did you take your blood pressure? (e.g., automatic home BP monitor, visiting nurse)     Doctors office 4. HISTORY: Do you have a history of high blood pressure?     Yes 5. MEDICINES: Are you taking any medicines for blood pressure? Have you missed any doses recently?     Missed 1 dose last last week 6. OTHER SYMPTOMS: Do you have any symptoms? (e.g., blurred vision, chest pain, difficulty breathing, headache, weakness)     Headache in her temples. Says she gets migraines and doesn't bat and eye at a headache  Protocols used: Blood Pressure - High-A-AH

## 2024-03-24 ENCOUNTER — Encounter: Payer: Self-pay | Admitting: Family

## 2024-03-25 NOTE — Telephone Encounter (Signed)
 Spoke to pt she made a mistake when she was talking about the Amlodipine  she was taking 2.5 mg not the 5 mg so when she stated that she took 3 it was 3 of the 2.5 mg not more than 10 mg. She would like clarification if she should be taking 5 mg total or 7.5 mg total?  Please advise?? Pt stated that she is NOT having any sxs  at all her bp has still been a little high 135/90 when she took mit earlier today.she will send other BP readings via my chart. She has already  been scheduled for appt when she can come in?

## 2024-03-30 ENCOUNTER — Ambulatory Visit (HOSPITAL_COMMUNITY)

## 2024-04-02 ENCOUNTER — Other Ambulatory Visit
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
## 2024-04-08 ENCOUNTER — Encounter: Payer: Self-pay | Admitting: Adult Health

## 2024-04-08 ENCOUNTER — Inpatient Hospital Stay

## 2024-04-08 ENCOUNTER — Inpatient Hospital Stay: Admitting: Adult Health

## 2024-04-08 VITALS — BP 129/78 | HR 73 | Temp 98.1°F | Resp 16 | Wt 146.0 lb

## 2024-04-08 DIAGNOSIS — C50411 Malignant neoplasm of upper-outer quadrant of right female breast: Secondary | ICD-10-CM | POA: Diagnosis not present

## 2024-04-08 DIAGNOSIS — Z1379 Encounter for other screening for genetic and chromosomal anomalies: Secondary | ICD-10-CM

## 2024-04-08 DIAGNOSIS — Z170 Estrogen receptor positive status [ER+]: Secondary | ICD-10-CM

## 2024-04-09 ENCOUNTER — Telehealth: Payer: Self-pay | Admitting: Family

## 2024-04-09 ENCOUNTER — Other Ambulatory Visit

## 2024-04-09 NOTE — Telephone Encounter (Signed)
 I see echocardiogram is scheduled for tomorrow. Is it approved with insurance?  I have some fax from [employer] and it asking for more info  are you able to see that from [employer]?

## 2024-04-10 ENCOUNTER — Ambulatory Visit (HOSPITAL_COMMUNITY): Admission: RE | Admit: 2024-04-10

## 2024-04-10 DIAGNOSIS — I159 Secondary hypertension, unspecified: Secondary | ICD-10-CM

## 2024-04-10 LAB — ECHOCARDIOGRAM COMPLETE
Area-P 1/2: 3.77 cm2
S' Lateral: 2.8 cm
Single Plane A4C EF: 62.1 %

## 2024-04-10 NOTE — Progress Notes (Signed)
" °  Echocardiogram 2D Echocardiogram has been performed.  Koleen KANDICE Popper, RDCS 04/10/2024, 3:46 PM "

## 2024-04-14 ENCOUNTER — Ambulatory Visit: Admitting: Family

## 2024-05-26 ENCOUNTER — Encounter

## 2024-10-08 ENCOUNTER — Inpatient Hospital Stay

## 2024-10-08 ENCOUNTER — Inpatient Hospital Stay: Admitting: Adult Health
